# Patient Record
Sex: Male | Born: 2000 | Race: White | Hispanic: No | Marital: Single | Smoking: Current every day smoker
Health system: Southern US, Community
[De-identification: ages and names within clinical notes are randomized; demographics above are authoritative.]

## PROBLEM LIST (undated history)

## (undated) DIAGNOSIS — Z862 Personal history of diseases of the blood and blood-forming organs and certain disorders involving the immune mechanism: Secondary | ICD-10-CM

## (undated) DIAGNOSIS — S129XXA Fracture of neck, unspecified, initial encounter: Secondary | ICD-10-CM

## (undated) DIAGNOSIS — F603 Borderline personality disorder: Secondary | ICD-10-CM

## (undated) DIAGNOSIS — F329 Major depressive disorder, single episode, unspecified: Secondary | ICD-10-CM

## (undated) DIAGNOSIS — F319 Bipolar disorder, unspecified: Secondary | ICD-10-CM

## (undated) DIAGNOSIS — F32A Depression, unspecified: Secondary | ICD-10-CM

## (undated) DIAGNOSIS — G4700 Insomnia, unspecified: Secondary | ICD-10-CM

## (undated) HISTORY — DX: Insomnia, unspecified: G47.00

## (undated) HISTORY — DX: Bipolar disorder, unspecified: F31.9

## (undated) HISTORY — DX: Borderline personality disorder: F60.3

## (undated) HISTORY — DX: Fracture of neck, unspecified, initial encounter: S12.9XXA

---
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
## 2018-02-03 ENCOUNTER — Encounter (HOSPITAL_COMMUNITY): Payer: Self-pay

## 2018-02-03 ENCOUNTER — Ambulatory Visit (HOSPITAL_COMMUNITY)
Admission: EM | Admit: 2018-02-03 | Discharge: 2018-02-03 | Disposition: A | Discharge status: Home / Self Care | Payer: BLUE CROSS/BLUE SHIELD | Attending: Internal Medicine | Admitting: Internal Medicine

## 2018-02-03 ENCOUNTER — Other Ambulatory Visit: Payer: Self-pay

## 2018-02-03 DIAGNOSIS — R51 Headache: Secondary | ICD-10-CM | POA: Diagnosis not present

## 2018-02-03 DIAGNOSIS — R42 Dizziness and giddiness: Secondary | ICD-10-CM | POA: Diagnosis not present

## 2018-02-03 DIAGNOSIS — R519 Headache, unspecified: Secondary | ICD-10-CM

## 2018-02-03 MED ORDER — AMOXICILLIN-POT CLAVULANATE 875-125 MG PO TABS: 1.0000 | ORAL_TABLET | Freq: Two times a day (BID) | ORAL | 0 refills | Status: DC

## 2018-02-03 MED ORDER — ONDANSETRON 4 MG PO TBDP
4.0000 mg | ORAL_TABLET | Freq: Once | ORAL | Status: AC
  Administered 2018-02-03: 4 mg via ORAL

## 2018-02-03 MED ORDER — DIPHENHYDRAMINE HCL 50 MG/ML IJ SOLN: 25.0000 mg | Freq: Once | INTRAMUSCULAR | Status: DC

## 2018-02-03 MED ORDER — ONDANSETRON 4 MG PO TBDP
ORAL_TABLET | ORAL | Status: AC
  Filled 2018-02-03: qty 1

## 2018-02-03 MED ORDER — DEXAMETHASONE SODIUM PHOSPHATE 10 MG/ML IJ SOLN
10.0000 mg | Freq: Once | INTRAMUSCULAR | Status: AC
  Administered 2018-02-03: 10 mg via INTRAMUSCULAR

## 2018-02-03 MED ORDER — DEXAMETHASONE SODIUM PHOSPHATE 10 MG/ML IJ SOLN
INTRAMUSCULAR | Status: AC
  Filled 2018-02-03: qty 1

## 2018-02-03 MED ORDER — DIPHENHYDRAMINE HCL 50 MG/ML IJ SOLN
INTRAMUSCULAR | Status: AC
  Filled 2018-02-03: qty 1

## 2018-02-03 MED ORDER — FLUTICASONE PROPIONATE 50 MCG/ACT NA SUSP
1.0000 | Freq: Every day | NASAL | 0 refills | Status: DC
Start: 2018-02-03 — End: 2018-06-02

## 2018-02-03 NOTE — Discharge Instructions (Signed)
Continue to push fluids and take over the counter medications as directed on the back of the box for symptomatic relief.   Patient does not improve in the next 2 to 3 days recommend starting antibiotics.

## 2018-02-03 NOTE — ED Triage Notes (Signed)
Pt c/o headache , dizzy and off balance x 3 days
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
## 2018-06-02 ENCOUNTER — Other Ambulatory Visit: Payer: Self-pay

## 2018-06-02 ENCOUNTER — Encounter (HOSPITAL_COMMUNITY): Payer: Self-pay

## 2018-06-02 ENCOUNTER — Inpatient Hospital Stay (HOSPITAL_COMMUNITY)
Admission: RE | Admit: 2018-06-02 | Discharge: 2018-06-06 | DRG: 885 | Disposition: A | Discharge status: Home / Self Care | Payer: Federal, State, Local not specified - Other | Attending: Psychiatry | Admitting: Psychiatry

## 2018-06-02 DIAGNOSIS — F332 Major depressive disorder, recurrent severe without psychotic features: Principal | ICD-10-CM | POA: Diagnosis present

## 2018-06-02 DIAGNOSIS — F17210 Nicotine dependence, cigarettes, uncomplicated: Secondary | ICD-10-CM | POA: Diagnosis present

## 2018-06-02 DIAGNOSIS — Z23 Encounter for immunization: Secondary | ICD-10-CM

## 2018-06-02 DIAGNOSIS — R45851 Suicidal ideations: Secondary | ICD-10-CM | POA: Diagnosis present

## 2018-06-02 LAB — COMPREHENSIVE METABOLIC PANEL
ALT: 31 U/L (ref 0–44)
AST: 22 U/L (ref 15–41)
Albumin: 4.3 g/dL (ref 3.5–5.0)
Alkaline Phosphatase: 85 U/L (ref 38–126)
Anion gap: 10 (ref 5–15)
BUN: 7 mg/dL (ref 6–20)
CO2: 27 mmol/L (ref 22–32)
CREATININE: 0.77 mg/dL (ref 0.61–1.24)
Calcium: 9.3 mg/dL (ref 8.9–10.3)
Chloride: 102 mmol/L (ref 98–111)
GFR calc Af Amer: >60 mL/min (ref >60)
GFR calc non Af Amer: >60 mL/min (ref >60)
GLUCOSE: 93 mg/dL (ref 70–99)
Potassium: 3.7 mmol/L (ref 3.5–5.1)
Sodium: 139 mmol/L (ref 135–145)
Total Bilirubin: 0.9 mg/dL (ref 0.3–1.2)
Total Protein: 7.1 g/dL (ref 6.5–8.1)

## 2018-06-02 LAB — CBC
HCT: 46.5 % (ref 39.0–52.0)
Hemoglobin: 15.5 g/dL (ref 13.0–17.0)
MCH: 29.5 pg (ref 26.0–34.0)
MCHC: 33.3 g/dL (ref 30.0–36.0)
MCV: 88.6 fL (ref 80.0–100.0)
Platelets: 282 10*3/uL (ref 150–400)
RBC: 5.25 MIL/uL (ref 4.22–5.81)
RDW: 12 % (ref 11.5–15.5)
WBC: 9 10*3/uL (ref 4.0–10.5)
nRBC: 0 % (ref 0.0–0.2)

## 2018-06-02 LAB — LIPID PANEL
Cholesterol: 117 mg/dL (ref 0–200)
HDL: 33 mg/dL — ABNORMAL LOW (ref >40)
LDL Cholesterol: 66 mg/dL (ref 0–99)
Total CHOL/HDL Ratio: 3.5 RATIO
Triglycerides: 88 mg/dL (ref <150)
VLDL: 18 mg/dL (ref 0–40)

## 2018-06-02 LAB — RAPID URINE DRUG SCREEN, HOSP PERFORMED
Amphetamines: NOT DETECTED
Barbiturates: NOT DETECTED
Benzodiazepines: NOT DETECTED
COCAINE: NOT DETECTED
Opiates: NOT DETECTED
Tetrahydrocannabinol: POSITIVE — AB

## 2018-06-02 LAB — TSH: TSH: 1.179 u[IU]/mL (ref 0.350–4.500)

## 2018-06-02 MED ORDER — HYDROXYZINE HCL 50 MG PO TABS
50.0000 mg | ORAL_TABLET | Freq: Three times a day (TID) | ORAL | Status: DC | PRN
  Administered 2018-06-02 – 2018-06-05 (×5): 50 mg via ORAL
  Filled 2018-06-02 (×4): qty 1
  Filled 2018-06-02: qty 10
  Filled 2018-06-02: qty 1

## 2018-06-02 MED ORDER — ALUM & MAG HYDROXIDE-SIMETH 200-200-20 MG/5ML PO SUSP: 30.0000 mL | ORAL | Status: DC | PRN

## 2018-06-02 MED ORDER — TRAZODONE HCL 50 MG PO TABS
50.0000 mg | ORAL_TABLET | Freq: Every evening | ORAL | Status: DC | PRN
  Administered 2018-06-02: 50 mg via ORAL
  Filled 2018-06-02: qty 1

## 2018-06-02 MED ORDER — HYDROXYZINE HCL 25 MG PO TABS: 25.0000 mg | ORAL_TABLET | Freq: Three times a day (TID) | ORAL | Status: DC | PRN

## 2018-06-02 MED ORDER — INFLUENZA VAC SPLIT QUAD 0.5 ML IM SUSY
0.5000 mL | PREFILLED_SYRINGE | INTRAMUSCULAR | Status: AC
  Administered 2018-06-03: 0.5 mL via INTRAMUSCULAR
  Filled 2018-06-02: qty 0.5

## 2018-06-02 MED ORDER — ESTRADIOL 1 MG PO TABS
2.0000 mg | ORAL_TABLET | Freq: Every day | ORAL | Status: DC
  Administered 2018-06-02 – 2018-06-06 (×5): 2 mg via ORAL
  Filled 2018-06-02 (×2): qty 1
  Filled 2018-06-02: qty 2
  Filled 2018-06-02: qty 14
  Filled 2018-06-02 (×4): qty 1

## 2018-06-02 MED ORDER — ARIPIPRAZOLE 5 MG PO TABS
5.0000 mg | ORAL_TABLET | Freq: Every day | ORAL | Status: DC
  Administered 2018-06-02 – 2018-06-06 (×5): 5 mg via ORAL
  Filled 2018-06-02 (×5): qty 1
  Filled 2018-06-02: qty 7
  Filled 2018-06-02 (×4): qty 1

## 2018-06-02 MED ORDER — ACETAMINOPHEN 325 MG PO TABS
650.0000 mg | ORAL_TABLET | Freq: Four times a day (QID) | ORAL | Status: DC | PRN
  Administered 2018-06-02 – 2018-06-05 (×7): 650 mg via ORAL
  Filled 2018-06-02 (×7): qty 2

## 2018-06-02 MED ORDER — MAGNESIUM HYDROXIDE 400 MG/5ML PO SUSP
30.0000 mL | Freq: Every day | ORAL | Status: DC | PRN
  Administered 2018-06-03: 30 mL via ORAL
  Filled 2018-06-02: qty 30

## 2018-06-02 MED ORDER — GABAPENTIN 100 MG PO CAPS
100.0000 mg | ORAL_CAPSULE | Freq: Two times a day (BID) | ORAL | Status: DC | PRN
  Administered 2018-06-02 – 2018-06-03 (×2): 100 mg via ORAL
  Filled 2018-06-02 (×2): qty 1

## 2018-06-02 MED ORDER — VENLAFAXINE HCL ER 75 MG PO CP24
75.0000 mg | ORAL_CAPSULE | Freq: Every day | ORAL | Status: DC
  Administered 2018-06-02 – 2018-06-06 (×5): 75 mg via ORAL
  Filled 2018-06-02 (×7): qty 1
  Filled 2018-06-02: qty 7

## 2018-06-02 MED ORDER — PNEUMOCOCCAL VAC POLYVALENT 25 MCG/0.5ML IJ INJ
0.5000 mL | INJECTION | INTRAMUSCULAR | Status: AC
  Administered 2018-06-03: 0.5 mL via INTRAMUSCULAR

## 2018-06-02 NOTE — Plan of Care (Signed)
  Problem: Education: Goal: Emotional status will improve Outcome: Progressing   

## 2018-06-02 NOTE — Progress Notes (Signed)
D Pt is observed OOB UAL on the 400 hall, " she" ( as he requests to be called) is approrpiately dressed, makes good eye contact and endorses a flat, depressed, quiet affect.      A She completed her daily assessment and on this she wrote she denied SI today and she rated her depression, hopelessness and anxeity " 5/5/4", respectively.      R Safety is in place.

## 2018-06-02 NOTE — H&P (Addendum)
Behavioral Health Medical Screening Exam  Brandon Cox is an 18 y.o. adult.  Patient reports that he has not taken medications in several weeks. Will restart Abilify at 5 mg and Effexor at 75 mg.  Total Time spent with patient: 20 minutes  Psychiatric Specialty Exam: Physical Exam  Constitutional: She is oriented to person, place, and time. She appears well-developed and well-nourished. No distress.  HENT:  Head: Normocephalic and atraumatic.  Right Ear: External ear normal.  Left Ear: External ear normal.  Eyes: Pupils are equal, round, and reactive to light.  Respiratory: Effort normal. No respiratory distress.  Musculoskeletal: Normal range of motion.  Neurological: She is alert and oriented to person, place, and time.  Skin: She is not diaphoretic.  Psychiatric: Her mood appears anxious. Her affect is blunt. She is not withdrawn and not actively hallucinating. Thought content is not paranoid and not delusional. She expresses impulsivity and inappropriate judgment. She exhibits a depressed mood. She expresses suicidal ideation. She expresses no homicidal ideation. She expresses no suicidal plans.    Review of Systems  Constitutional: Negative for chills, diaphoresis, fever, malaise/fatigue and weight loss.  Psychiatric/Behavioral: Positive for depression and suicidal ideas. Negative for hallucinations, memory loss and substance abuse. The patient is nervous/anxious and has insomnia.   All other systems reviewed and are negative.   There were no vitals taken for this visit.There is no height or weight on file to calculate BMI.  General Appearance: Casual and Disheveled  Eye Contact:  Fair  Speech:  Clear and Coherent and Normal Rate  Volume:  Decreased  Mood:  Anxious, Depressed, Hopeless, Irritable and Worthless  Affect:  Blunt and Congruent  Thought Process:  Coherent, Goal Directed and Descriptions of Associations: Intact  Orientation:  Full (Time, Place, and Person)   Thought Content:  Logical and Hallucinations: None  Suicidal Thoughts:  Yes.  without intent/plan  Homicidal Thoughts:  No  Memory:  Immediate;   Good Recent;   Fair  Judgement:  Intact  Insight:  Fair  Psychomotor Activity:  Normal  Concentration: Concentration: Fair and Attention Span: Fair  Recall:  [employer] of Knowledge:Good  Language: Good  Akathisia:  NA  Handed:  Right  AIMS (if indicated):     Assets:  Communication Skills Desire for Improvement Housing Intimacy Leisure Time Physical Health  Sleep:       Musculoskeletal: Strength & Muscle Tone: within normal limits Gait & Station: normal    Recommendations:  Based on my evaluation the patient does not appear to have an emergency medical condition.   Patient reports that he has not taken medications in several weeks. Will resume Abilify at 5 mg and Effexor at 75 mg.  Jackelyn Poling, NP 06/02/2018, 2:32 AM

## 2018-06-02 NOTE — Progress Notes (Signed)
D: Patient observed resting in bed initially this evening however promptly came down for wrap up group. Patient's only complaint at this time is being tired as she was admitted in the early AM hours last night. Patient's affect anxious and depressed with congruent mood. Rated chronic cervical pain at a 3/10 but does not desire anything for pain. No other physical complaints.   A: Medicated per orders, prn neurontin and trazadone given per her request. Medication education provided. Level III obs in place for safety. Emotional support offered. Patient encouraged to complete Suicide Safety Plan before discharge. Encouraged to attend and participate in unit programming.   R: Patient verbalizes understanding of POC. On reassess, patient is asleep. Patient denies SI/HI/AVH and remains safe on level III obs. Will continue to monitor throughout the night.

## 2018-06-02 NOTE — [facility] Suicide Risk Assessment (Signed)
[facility] Admission Suicide Risk Assessment   Nursing information obtained from:  Patient Demographic factors:  Male, Cardell Peach, lesbian, or bisexual orientation, Caucasian Current Mental Status:  Suicidal ideation indicated by patient Loss Factors:  Financial problems / change in socioeconomic status Historical Factors:  Prior suicide attempts, Family history of mental illness or substance abuse Risk Reduction Factors:  Employed  Total Time spent with patient: 20 minutes Principal Problem: <principal problem not specified> Diagnosis:  Active Problems:   Severe recurrent major depression without psychotic features (HCC)  Subjective Data: Patient is seen and examined.  Patient is a 18 year old transgender male to male who presented as a walk-in to the behavioral health hospital on 06/02/2018 with suicidal ideation.  The patient has a past psychiatric history significant for major depression and was last admitted to our facility on 08/14/2017.  She was in the hospital for 4 days at that time.  She was discharged on Abilify 5 mg p.o. daily, estradiol 1 mg p.o. twice daily, trazodone 50 mg p.o. nightly and venlafaxine 75 mg p.o. daily.  The patient stated that she was doing well, but unfortunately over the last several months had lost her insurance.  After she lost her insurance she was unable to obtain her psychiatric medicines as well as her hormonal medicines for her transgender transition.  She stated that things over the last week or 2 it worsened.  She was having thoughts of self-harm.  Prior to being admitted to the hospital last night she was thinking about cutting herself.  The decision was made because of the worsening depression that she should be admitted to the hospital for evaluation and stabilization.  She denied any recent additional stressors.  It should be noted that the patient's family is not supportive of her transgender physician.  Continued Clinical Symptoms:  Alcohol Use Disorder  Identification Test Final Score (AUDIT): 0 The "Alcohol Use Disorders Identification Test", Guidelines for Use in Primary Care, Second Edition.  World Science writer [facility]). Score between 0-7:  no or low risk or alcohol related problems. Score between 8-15:  moderate risk of alcohol related problems. Score between 16-19:  high risk of alcohol related problems. Score 20 or above:  warrants further diagnostic evaluation for alcohol dependence and treatment.   CLINICAL FACTORS:   Depression:   Hopelessness Impulsivity Insomnia Previous Psychiatric Diagnoses and Treatments   Musculoskeletal: Strength & Muscle Tone: within normal limits Gait & Station: normal Patient leans: N/A  Psychiatric Specialty Exam: Physical Exam  Nursing note and vitals reviewed. Constitutional: She is oriented to person, place, and time. She appears well-developed and well-nourished.  HENT:  Head: Normocephalic and atraumatic.  Respiratory: Effort normal.  Neurological: She is alert and oriented to person, place, and time.    ROS  Blood pressure (!) 142/88, pulse 67, temperature 98.4 F (36.9 C), temperature source Oral, resp. rate 16, height 6\' 1"  (1.854 m), weight 101.2 kg.Body mass index is 29.42 kg/m.  General Appearance: Casual  Eye Contact:  Fair  Speech:  Normal Rate  Volume:  Decreased  Mood:  Depressed  Affect:  Congruent  Thought Process:  Coherent and Descriptions of Associations: Intact  Orientation:  Full (Time, Place, and Person)  Thought Content:  Logical  Suicidal Thoughts:  Yes.  without intent/plan  Homicidal Thoughts:  No  Memory:  Immediate;   Fair Recent;   Fair Remote;   Fair  Judgement:  Intact  Insight:  Fair  Psychomotor Activity:  Psychomotor Retardation  Concentration:  Concentration: Fair  and Attention Span: Fair  Recall:  [employer] of Knowledge:  Fair  Language:  Good  Akathisia:  Negative  Handed:  Right  AIMS (if indicated):     Assets:  Communication  Skills Desire for Improvement Housing Leisure Time Physical Health Resilience  ADL's:  Intact  Cognition:  WNL  Sleep:  Number of Hours: 1.25      COGNITIVE FEATURES THAT CONTRIBUTE TO RISK:  None    SUICIDE RISK:   Mild:  Suicidal ideation of limited frequency, intensity, duration, and specificity.  There are no identifiable plans, no associated intent, mild dysphoria and related symptoms, good self-control (both objective and subjective assessment), few other risk factors, and identifiable protective factors, including available and accessible social support.  PLAN OF CARE: Patient is seen and examined.  Patient is a 18 year old male to male transgender patient with a past psychiatric history significant for major depression.  She will be admitted to the hospital.  She will be integrated into the milieu.  She will be encouraged to attend groups and work on Pharmacologist.  Her Effexor and Abilify will be restarted at their previous dosages.  We will additionally give her the Lupron and estradiol.  We will have to find out about resources available for mental health as well as medical issues for follow-up as an outpatient given the loss of her insurance.  Review of the laboratories that are available at this time show a normal CBC.  The urine drug screen was positive for marijuana.  I certify that inpatient services furnished can reasonably be expected to improve the patient's condition.   Antonieta Pert, MD 06/02/2018, 7:49 AM

## 2018-06-02 NOTE — H&P (Signed)
Psychiatric Admission Assessment Adult  Patient Identification: Brandon Cox MRN:  [id number] Date of Evaluation:  06/02/2018 Chief Complaint:  MDD, Recurrent, Severe, Without Psychotic Features Principal Diagnosis: <principal problem not specified> Diagnosis:  Active Problems:   Severe recurrent major depression without psychotic features (HCC)  History of Present Illness: Patient is seen and examined.  Patient is a 18 year old transgender male to male who presented as a walk-in to the behavioral health hospital on 06/02/2018 with suicidal ideation.  The patient has a past psychiatric history significant for major depression and was last admitted to our facility on 08/14/2017.  She was in the hospital for 4 days at that time.  She was discharged on Abilify 5 mg p.o. daily, estradiol 1 mg p.o. twice daily, trazodone 50 mg p.o. nightly and venlafaxine 75 mg p.o. daily.  The patient stated that she was doing well, but unfortunately over the last several months had lost her insurance.  After she lost her insurance she was unable to obtain her psychiatric medicines as well as her hormonal medicines for her transgender transition.  She stated that things over the last week or 2 it worsened.  She was having thoughts of self-harm.  Prior to being admitted to the hospital last night she was thinking about cutting herself.  The decision was made because of the worsening depression that she should be admitted to the hospital for evaluation and stabilization.  She denied any recent additional stressors.  It should be noted that the patient's family is not supportive of her transgender physician.  Associated Signs/Symptoms: Depression Symptoms:  depressed mood, anhedonia, insomnia, psychomotor retardation, fatigue, feelings of worthlessness/guilt, difficulty concentrating, hopelessness, suicidal thoughts without plan, anxiety, loss of energy/fatigue, disturbed sleep, (Hypo) Manic Symptoms:   Impulsivity, Anxiety Symptoms:  Excessive Worry, Psychotic Symptoms:  Denied PTSD Symptoms: Negative Total Time spent with patient: 45 minutes  Past Psychiatric History: Patient's last admission to our facility was on 08/14/2017.  She was diagnosed with major depression at that time.  She also had a prior psychiatric hospitalization at [facility].  There was a suicide attempt at age 18.  A remote history of self cutting while in middle school.  Is the patient at risk to self? Yes.    Has the patient been a risk to self in the past 6 months? Yes.    Has the patient been a risk to self within the distant past? No.  Is the patient a risk to others? No.  Has the patient been a risk to others in the past 6 months? No.  Has the patient been a risk to others within the distant past? No.   Prior Inpatient Therapy: Prior Inpatient Therapy: Yes Prior Therapy Dates: 2019, 2018 Prior Therapy Facilty/Provider(s): [facility] Reason for Treatment: MDD Prior Outpatient Therapy: Prior Outpatient Therapy: Yes Prior Therapy Dates: 2018 Prior Therapy Facilty/Provider(s): [facility]  Reason for Treatment: DEPRESSION Does patient have an ACCT team?: No Does patient have Intensive In-House Services?  : No Does patient have [facility] services? : No Does patient have P4CC services?: No  Alcohol Screening: 1. How often do you have a drink containing alcohol?: Never 2. How many drinks containing alcohol do you have on a typical day when you are drinking?: 1 or 2 3. How often do you have six or more drinks on one occasion?: Never AUDIT-C Score: 0 9. Have you or someone else been injured as a result of your drinking?: No 10. Has a relative or friend or  a doctor or another health worker been concerned about your drinking or suggested you cut down?: No Alcohol Use Disorder Identification Test Final Score (AUDIT): 0 Alcohol Brief Interventions/Follow-up: AUDIT Score <7 follow-up not indicated Substance Abuse  History in the last 12 months:  No. Consequences of Substance Abuse: Negative Previous Psychotropic Medications: Yes  Psychological Evaluations: Yes  Past Medical History:  Past Medical History:  Diagnosis Date  . Depression   . Fracture, cervical vertebra (HCC) 10/22/2015   c6  . History of ITP    History reviewed. No pertinent surgical history. Family History:  Family History  Problem Relation Age of Onset  . Healthy Mother   . Drug abuse Cousin    Family Psychiatric  History: Denied Tobacco Screening: Have you used any form of tobacco in the last 30 days? (Cigarettes, Smokeless Tobacco, Cigars, and/or Pipes): Yes Tobacco use, Select all that apply: 5 or more cigarettes per day Are you interested in Tobacco Cessation Medications?: No, patient refused Counseled patient on smoking cessation including recognizing danger situations, developing coping skills and basic information about quitting provided: Refused/Declined practical counseling Social History:  Social History   Substance and Sexual Activity  Alcohol Use No  . Frequency: Never     Social History   Substance and Sexual Activity  Drug Use Yes  . Types: Marijuana   Comment: used a couple of times in the last couple weeks    Additional Social History: Marital status: Single    Pain Medications: See MAR Prescriptions: See MAR Over the Counter: See MAR History of alcohol / drug use?: No history of alcohol / drug abuse                    Allergies:  No Known Allergies Lab Results:  Results for orders placed or performed during the hospital encounter of 06/02/18 (from the past 48 hour(s))  Urine rapid drug screen (hosp performed)not at [facility]     Status: Abnormal   Collection Time: 06/02/18  4:06 AM  Result Value Ref Range   Opiates NONE DETECTED NONE DETECTED   Cocaine NONE DETECTED NONE DETECTED   Benzodiazepines NONE DETECTED NONE DETECTED   Amphetamines NONE DETECTED NONE DETECTED    Tetrahydrocannabinol POSITIVE (A) NONE DETECTED   Barbiturates NONE DETECTED NONE DETECTED    Comment: (NOTE) DRUG SCREEN FOR MEDICAL PURPOSES ONLY.  IF CONFIRMATION IS NEEDED FOR ANY PURPOSE, NOTIFY LAB WITHIN 5 DAYS. LOWEST DETECTABLE LIMITS FOR URINE DRUG SCREEN Drug Class                     Cutoff (ng/mL) Amphetamine and metabolites    1000 Barbiturate and metabolites    200 Benzodiazepine                 200 Tricyclics and metabolites     300 Opiates and metabolites        300 Cocaine and metabolites        300 THC                            50 Performed at [facility], [address] [place], [place] [address]   CBC     Status: None   Collection Time: 06/02/18  6:31 AM  Result Value Ref Range   WBC 9.0 4.0 - 10.5 K/uL   RBC 5.25 4.22 - 5.81 MIL/uL   Hemoglobin 15.5 13.0 - 17.0 g/dL  HCT 46.5 39.0 - 52.0 %   MCV 88.6 80.0 - 100.0 fL   MCH 29.5 26.0 - 34.0 pg   MCHC 33.3 30.0 - 36.0 g/dL   RDW 82.9 56.2 - 13.0 %   Platelets 282 150 - 400 K/uL   nRBC 0.0 0.0 - 0.2 %    Comment: Performed at [facility], [address], [place], [place] [address]  Comprehensive metabolic panel     Status: None   Collection Time: 06/02/18  6:31 AM  Result Value Ref Range   Sodium 139 135 - 145 mmol/L   Potassium 3.7 3.5 - 5.1 mmol/L   Chloride 102 98 - 111 mmol/L   CO2 27 22 - 32 mmol/L   Glucose, Bld 93 70 - 99 mg/dL   BUN 7 6 - 20 mg/dL   Creatinine, Ser 4.69 0.61 - 1.24 mg/dL   Calcium 9.3 8.9 - 62.9 mg/dL   Total Protein 7.1 6.5 - 8.1 g/dL   Albumin 4.3 3.5 - 5.0 g/dL   AST 22 15 - 41 U/L   ALT 31 0 - 44 U/L   Alkaline Phosphatase 85 38 - 126 U/L   Total Bilirubin 0.9 0.3 - 1.2 mg/dL   GFR calc non Af Amer >60 >60 mL/min   GFR calc Af Amer >60 >60 mL/min   Anion gap 10 5 - 15    Comment: Performed at [facility], [address] [place], [place] [address]  Lipid panel     Status: Abnormal   Collection Time:  06/02/18  6:31 AM  Result Value Ref Range   Cholesterol 117 0 - 200 mg/dL   Triglycerides 88 <324 mg/dL   HDL 33 (L) >40 mg/dL   Total CHOL/HDL Ratio 3.5 RATIO   VLDL 18 0 - 40 mg/dL   LDL Cholesterol 66 0 - 99 mg/dL    Comment:        Total Cholesterol/HDL:CHD Risk Coronary Heart Disease Risk Table                     Men   Women  1/2 Average Risk   3.4   3.3  Average Risk       5.0   4.4  2 X Average Risk   9.6   7.1  3 X Average Risk  23.4   11.0        Use the calculated Patient Ratio above and the CHD Risk Table to determine the patient's CHD Risk.        ATP III CLASSIFICATION (LDL):  <100     mg/dL   Optimal  102-725  mg/dL   Near or Above                    Optimal  130-159  mg/dL   Borderline  366-440  mg/dL   High  >347     mg/dL   Very High Performed at [facility], [address], [place], [place] [address]   TSH     Status: None   Collection Time: 06/02/18  6:31 AM  Result Value Ref Range   TSH 1.179 0.350 - 4.500 uIU/mL    Comment: Performed by a 3rd Generation assay with a functional sensitivity of <=0.01 uIU/mL. Performed at [facility], [address], [place], [place] [address]     Blood Alcohol level:  Lab Results  Component Value Date   ETH <10  02/16/2017    Metabolic Disorder Labs:  No results found for: HGBA1C, MPG No results found for: PROLACTIN Lab Results  Component Value Date   CHOL 117 06/02/2018   TRIG 88 06/02/2018   HDL 33 (L) 06/02/2018   CHOLHDL 3.5 06/02/2018   VLDL 18 06/02/2018   LDLCALC 66 06/02/2018    Current Medications: Current Facility-Administered Medications  Medication Dose Route Frequency Provider Last Rate Last Dose  . acetaminophen (TYLENOL) tablet 650 mg  650 mg Oral Q6H PRN Nira Conn A, NP   650 mg at 06/02/18 0402  . alum & mag hydroxide-simeth (MAALOX/MYLANTA) 200-200-20 MG/5ML suspension 30 mL  30 mL Oral Q4H PRN Nira Conn A, NP      . ARIPiprazole (ABILIFY)  tablet 5 mg  5 mg Oral Daily Nira Conn A, NP   5 mg at 06/02/18 1120  . estradiol (ESTRACE) tablet 2 mg  2 mg Oral Daily Nira Conn A, NP   2 mg at 06/02/18 1119  . gabapentin (NEURONTIN) capsule 100 mg  100 mg Oral BID PRN Nira Conn A, NP      . hydrOXYzine (ATARAX/VISTARIL) tablet 50 mg  50 mg Oral TID PRN Nira Conn A, NP   50 mg at 06/02/18 0402  . [START ON 06/03/2018] Influenza vac split quadrivalent PF (FLUARIX) injection 0.5 mL  0.5 mL Intramuscular Tomorrow-1000 Malvin Johns, MD      . magnesium hydroxide (MILK OF MAGNESIA) suspension 30 mL  30 mL Oral Daily PRN Jackelyn Poling, NP      . Melene Muller ON 06/03/2018] pneumococcal 23 valent vaccine (PNU-IMMUNE) injection 0.5 mL  0.5 mL Intramuscular Tomorrow-1000 Malvin Johns, MD      . traZODone (DESYREL) tablet 50 mg  50 mg Oral QHS PRN Nira Conn A, NP      . venlafaxine XR (EFFEXOR-XR) 24 hr capsule 75 mg  75 mg Oral Q breakfast Nira Conn A, NP   75 mg at 06/02/18 1121   PTA Medications: Medications Prior to Admission  Medication Sig Dispense Refill Last Dose  . estradiol (ESTRACE) 1 MG tablet Take 2 mg by mouth daily.     Marland Kitchen amitriptyline (ELAVIL) 25 MG tablet Take 25 mg by mouth at bedtime as needed for sleep.     . ARIPiprazole (ABILIFY) 10 MG tablet Take 10 mg by mouth daily.     Marland Kitchen gabapentin (NEURONTIN) 100 MG capsule Take 100 mg by mouth 2 (two) times daily as needed for anxiety.     . hydrOXYzine (ATARAX/VISTARIL) 50 MG tablet Take 50 mg by mouth 3 (three) times daily as needed. for anxiety  3   . venlafaxine XR (EFFEXOR-XR) 150 MG 24 hr capsule Take 150 mg by mouth daily.       Musculoskeletal: Strength & Muscle Tone: within normal limits Gait & Station: normal Patient leans: N/A  Psychiatric Specialty Exam: Physical Exam  Constitutional: She is oriented to person, place, and time. She appears well-developed and well-nourished.  HENT:  Head: Normocephalic and atraumatic.  Respiratory: Effort normal.   Neurological: She is alert and oriented to person, place, and time.    ROS  Blood pressure (!) 142/88, pulse 67, temperature 98.4 F (36.9 C), temperature source Oral, resp. rate 16, height 6\' 1"  (1.854 m), weight 101.2 kg.Body mass index is 29.42 kg/m.  General Appearance: Casual  Eye Contact:  Fair  Speech:  Normal Rate  Volume:  Decreased  Mood:  Depressed  Affect:  Congruent  Thought Process:  Coherent and Descriptions of Associations: Intact  Orientation:  Full (Time, Place, and Person)  Thought Content:  Logical  Suicidal Thoughts:  Yes.  without intent/plan  Homicidal Thoughts:  No  Memory:  Immediate;   Fair Recent;   Fair Remote;   Fair  Judgement:  Intact  Insight:  Fair  Psychomotor Activity:  Psychomotor Retardation  Concentration:  Concentration: Fair and Attention Span: Fair  Recall:  [employer]  Fund of Knowledge:  Fair  Language:  Fair  Akathisia:  Negative  Handed:  Right  AIMS (if indicated):     Assets:  Communication Skills Desire for Improvement Financial Resources/Insurance Housing Physical Health Resilience Social Support  ADL's:  Intact  Cognition:  WNL  Sleep:  Number of Hours: 1.25    Treatment Plan Summary: Daily contact with patient to assess and evaluate symptoms and progress in treatment, Medication management and Plan : Patient is seen and examined.  Patient is a 18 year old male to male transgender patient with a past psychiatric history significant for major depression.  She will be admitted to the hospital.  She will be integrated into the milieu.  She will be encouraged to attend groups and work on Pharmacologistcoping skills.  Her Effexor and Abilify will be restarted at their previous dosages.  We will additionally give her the Lupron and estradiol.  We will have to find out about resources available for mental health as well as medical issues for follow-up as an outpatient given the loss of her insurance.  Review of the laboratories that are available  at this time show a normal CBC.  The urine drug screen was positive for marijuana.  Observation Level/Precautions:  15 minute checks  Laboratory:  Chemistry Profile  Psychotherapy:    Medications:    Consultations:    Discharge Concerns:    Estimated LOS:  Other:     Physician Treatment Plan for Primary Diagnosis: <principal problem not specified> Long Term Goal(s): Improvement in symptoms so as ready for discharge  Short Term Goals: Ability to identify changes in lifestyle to reduce recurrence of condition will improve, Ability to verbalize feelings will improve, Ability to disclose and discuss suicidal ideas, Ability to demonstrate self-control will improve, Ability to identify and develop effective coping behaviors will improve, Ability to maintain clinical measurements within normal limits will improve and Compliance with prescribed medications will improve  Physician Treatment Plan for Secondary Diagnosis: Active Problems:   Severe recurrent major depression without psychotic features (HCC)  Long Term Goal(s): Improvement in symptoms so as ready for discharge  Short Term Goals: Ability to identify changes in lifestyle to reduce recurrence of condition will improve, Ability to verbalize feelings will improve, Ability to disclose and discuss suicidal ideas, Ability to demonstrate self-control will improve, Ability to identify and develop effective coping behaviors will improve, Ability to maintain clinical measurements within normal limits will improve and Compliance with prescribed medications will improve  I certify that inpatient services furnished can reasonably be expected to improve the patient's condition.    Antonieta PertGreg Lawson , MD 2/15/202012:27 PM

## 2018-06-02 NOTE — Tx Team (Signed)
Initial Treatment Plan 06/02/2018 3:14 AM [name] [id number]    PATIENT STRESSORS: Financial difficulties Marital or family conflict Medication change or noncompliance   PATIENT STRENGTHS: Communication skills General fund of knowledge Motivation for treatment/growth Physical Health Work skills   PATIENT IDENTIFIED PROBLEMS: Depression  Suicidal ideation  "Not have to come back to the hospital"  "Improve my self worth"               DISCHARGE CRITERIA:  Improved stabilization in mood, thinking, and/or behavior Need for constant or close observation no longer present Verbal commitment to aftercare and medication compliance  PRELIMINARY DISCHARGE PLAN: Outpatient therapy Medication management  PATIENT/FAMILY INVOLVEMENT: This treatment plan has been presented to and reviewed with the patient, Brandon Cox.  The patient and family have been given the opportunity to ask questions and make suggestions.  Levin Bacon, RN 06/02/2018, 3:14 AM

## 2018-06-02 NOTE — H&P (Signed)
Psychiatric Admission Assessment Adult  Patient Identification: Brandon Cox MRN:  [id number] Date of Evaluation:  06/02/2018 Chief Complaint:  MDD, Recurrent, Severe, Without Psychotic Features Principal Diagnosis: Severe recurrent major depression without psychotic features (HCC) Diagnosis:  Principal Problem:   Severe recurrent major depression without psychotic features (HCC)  History of Present Illness: On admission to the hospital:  Brandon Cox is an 18 y.o. adult who presents to [facility] as a walk in. Pt is male to male transgender and prefers to be called "Brandon Cox." Pt states she has been increasingly suicidal and has racing thoughts of suicide. Pt states she was at work and had thoughts of cutting her wrists. Pt states she tried to stop herself but the thoughts persisted and she ended up using a knife to cut herself. Pt states she has attempted suicide at least 6 times in the past by overdosing on medication several times and trying to hang herself multiple times. Pt states her most recent suicide attempt was about 3 months ago. Pt states she tried to hang herself from her balcony but she was too tall and the rope did not work.   Pt states her suicide attempts are impulsive and reports they are not triggered by any specific event. Pt states she lives at home with her parents who are unsupportive. Pt is not followed by any current MH OPT provider and states the last time she saw a provider was about 1 year ago. Pt states she does not believe that she can keep herself safe and agrees to sign VOL consent for treatment.   On assessment today, depression is 3/10, anxiety is high, denies suicidal ideations.  Does not like to be around people based on his being bullied his whole life for his speech apraxia issues.  Never processed this but is agreeable to address in therapy.     Associated Signs/Symptoms: Depression Symptoms:  depressed mood, hopelessness, loss of energy/fatigue, disturbed  sleep, (Hypo) Manic Symptoms:  Labiality of Mood, Anxiety Symptoms:  Excessive Worry, Psychotic Symptoms:  none PTSD Symptoms: Had a traumatic exposure:  years of bullying Total Time spent with patient: 45 minutes  Past Psychiatric History: depression, personality disorder  Is the patient at risk to self? Yes.    Has the patient been a risk to self in the past 6 months? Yes.    Has the patient been a risk to self within the distant past? Yes.    Is the patient a risk to others? No.  Has the patient been a risk to others in the past 6 months? No.  Has the patient been a risk to others within the distant past? No.   Prior Inpatient Therapy: Prior Inpatient Therapy: Yes Prior Therapy Dates: 2019, 2018 Prior Therapy Facilty/Provider(s): [facility] Reason for Treatment: MDD Prior Outpatient Therapy: Prior Outpatient Therapy: Yes Prior Therapy Dates: 2018 Prior Therapy Facilty/Provider(s): [facility]  Reason for Treatment: DEPRESSION Does patient have an ACCT team?: No Does patient have Intensive In-House Services?  : No Does patient have [facility] services? : No Does patient have P4CC services?: No  Alcohol Screening: 1. How often do you have a drink containing alcohol?: Never 2. How many drinks containing alcohol do you have on a typical day when you are drinking?: 1 or 2 3. How often do you have six or more drinks on one occasion?: Never AUDIT-C Score: 0 9. Have you or someone else been injured as a result of your drinking?: No 10. Has a relative  or friend or a doctor or another health worker been concerned about your drinking or suggested you cut down?: No Alcohol Use Disorder Identification Test Final Score (AUDIT): 0 Alcohol Brief Interventions/Follow-up: AUDIT Score <7 follow-up not indicated Substance Abuse History in the last 12 months:  No. Consequences of Substance Abuse: NA Previous Psychotropic Medications: Yes  Psychological Evaluations: Yes  Past Medical History:   Past Medical History:  Diagnosis Date  . Depression   . Fracture, cervical vertebra (HCC) 10/22/2015   c6  . History of ITP    History reviewed. No pertinent surgical history. Family History:  Family History  Problem Relation Age of Onset  . Healthy Mother   . Drug abuse Cousin    Family Psychiatric  History: cousin with substance abuse Tobacco Screening: Have you used any form of tobacco in the last 30 days? (Cigarettes, Smokeless Tobacco, Cigars, and/or Pipes): Yes Tobacco use, Select all that apply: 5 or more cigarettes per day Are you interested in Tobacco Cessation Medications?: No, patient refused Counseled patient on smoking cessation including recognizing danger situations, developing coping skills and basic information about quitting provided: Refused/Declined practical counseling Social History:  Social History   Substance and Sexual Activity  Alcohol Use No  . Frequency: Never     Social History   Substance and Sexual Activity  Drug Use Yes  . Types: Marijuana   Comment: used a couple of times in the last couple weeks    Additional Social History: Marital status: Single    Pain Medications: See MAR Prescriptions: See MAR Over the Counter: See MAR History of alcohol / drug use?: No history of alcohol / drug abuse  Allergies:  No Known Allergies Lab Results:  Results for orders placed or performed during the hospital encounter of 06/02/18 (from the past 48 hour(s))  Urine rapid drug screen (hosp performed)not at [facility]     Status: Abnormal   Collection Time: 06/02/18  4:06 AM  Result Value Ref Range   Opiates NONE DETECTED NONE DETECTED   Cocaine NONE DETECTED NONE DETECTED   Benzodiazepines NONE DETECTED NONE DETECTED   Amphetamines NONE DETECTED NONE DETECTED   Tetrahydrocannabinol POSITIVE (A) NONE DETECTED   Barbiturates NONE DETECTED NONE DETECTED    Comment: (NOTE) DRUG SCREEN FOR MEDICAL PURPOSES ONLY.  IF CONFIRMATION IS NEEDED FOR ANY PURPOSE,  NOTIFY LAB WITHIN 5 DAYS. LOWEST DETECTABLE LIMITS FOR URINE DRUG SCREEN Drug Class                     Cutoff (ng/mL) Amphetamine and metabolites    1000 Barbiturate and metabolites    200 Benzodiazepine                 200 Tricyclics and metabolites     300 Opiates and metabolites        300 Cocaine and metabolites        300 THC                            50 Performed at [facility], [address] [place], [place] [address]   CBC     Status: None   Collection Time: 06/02/18  6:31 AM  Result Value Ref Range   WBC 9.0 4.0 - 10.5 K/uL   RBC 5.25 4.22 - 5.81 MIL/uL   Hemoglobin 15.5 13.0 - 17.0 g/dL   HCT 60.446.5 54.039.0 - 98.152.0 %   MCV 88.6  80.0 - 100.0 fL   MCH 29.5 26.0 - 34.0 pg   MCHC 33.3 30.0 - 36.0 g/dL   RDW 43.1 54.0 - 08.6 %   Platelets 282 150 - 400 K/uL   nRBC 0.0 0.0 - 0.2 %    Comment: Performed at [facility], [address] [place], [place] [address]  Comprehensive metabolic panel     Status: None   Collection Time: 06/02/18  6:31 AM  Result Value Ref Range   Sodium 139 135 - 145 mmol/L   Potassium 3.7 3.5 - 5.1 mmol/L   Chloride 102 98 - 111 mmol/L   CO2 27 22 - 32 mmol/L   Glucose, Bld 93 70 - 99 mg/dL   BUN 7 6 - 20 mg/dL   Creatinine, Ser 0.93 0.61 - 1.24 mg/dL   Calcium 9.3 8.9 - 26.7 mg/dL   Total Protein 7.1 6.5 - 8.1 g/dL   Albumin 4.3 3.5 - 5.0 g/dL   AST 22 15 - 41 U/L   ALT 31 0 - 44 U/L   Alkaline Phosphatase 85 38 - 126 U/L   Total Bilirubin 0.9 0.3 - 1.2 mg/dL   GFR calc non Af Amer >60 >60 mL/min   GFR calc Af Amer >60 >60 mL/min   Anion gap 10 5 - 15    Comment: Performed at [facility], [address], [place], [place] [address]  Lipid panel     Status: Abnormal   Collection Time: 06/02/18  6:31 AM  Result Value Ref Range   Cholesterol 117 0 - 200 mg/dL   Triglycerides 88 <099 mg/dL   HDL 33 (L) >83 mg/dL   Total CHOL/HDL Ratio 3.5 RATIO   VLDL 18 0 - 40 mg/dL   LDL  Cholesterol 66 0 - 99 mg/dL    Comment:        Total Cholesterol/HDL:CHD Risk Coronary Heart Disease Risk Table                     Men   Women  1/2 Average Risk   3.4   3.3  Average Risk       5.0   4.4  2 X Average Risk   9.6   7.1  3 X Average Risk  23.4   11.0        Use the calculated Patient Ratio above and the CHD Risk Table to determine the patient's CHD Risk.        ATP III CLASSIFICATION (LDL):  <100     mg/dL   Optimal  382-505  mg/dL   Near or Above                    Optimal  130-159  mg/dL   Borderline  397-673  mg/dL   High  >419     mg/dL   Very High Performed at [facility], [address], [place], [place] [address]   TSH     Status: None   Collection Time: 06/02/18  6:31 AM  Result Value Ref Range   TSH 1.179 0.350 - 4.500 uIU/mL    Comment: Performed by a 3rd Generation assay with a functional sensitivity of <=0.01 uIU/mL. Performed at [facility], [address] [place], [place] [address]     Blood Alcohol level:  Lab Results  Component Value Date   [facility] <10 02/16/2017    Metabolic Disorder Labs:  No results  found for: HGBA1C, MPG No results found for: PROLACTIN Lab Results  Component Value Date   CHOL 117 06/02/2018   TRIG 88 06/02/2018   HDL 33 (L) 06/02/2018   CHOLHDL 3.5 06/02/2018   VLDL 18 06/02/2018   LDLCALC 66 06/02/2018    Current Medications: Current Facility-Administered Medications  Medication Dose Route Frequency Provider Last Rate Last Dose  . acetaminophen (TYLENOL) tablet 650 mg  650 mg Oral Q6H PRN Nira Conn A, NP   650 mg at 06/02/18 0402  . alum & mag hydroxide-simeth (MAALOX/MYLANTA) 200-200-20 MG/5ML suspension 30 mL  30 mL Oral Q4H PRN Nira Conn A, NP      . ARIPiprazole (ABILIFY) tablet 5 mg  5 mg Oral Daily Nira Conn A, NP   5 mg at 06/02/18 1120  . estradiol (ESTRACE) tablet 2 mg  2 mg Oral Daily Nira Conn A, NP   2 mg at 06/02/18 1119  . gabapentin (NEURONTIN)  capsule 100 mg  100 mg Oral BID PRN Nira Conn A, NP      . hydrOXYzine (ATARAX/VISTARIL) tablet 50 mg  50 mg Oral TID PRN Nira Conn A, NP   50 mg at 06/02/18 0402  . [START ON 06/03/2018] Influenza vac split quadrivalent PF (FLUARIX) injection 0.5 mL  0.5 mL Intramuscular Tomorrow-1000 Malvin Johns, MD      . magnesium hydroxide (MILK OF MAGNESIA) suspension 30 mL  30 mL Oral Daily PRN Jackelyn Poling, NP      . Melene Muller ON 06/03/2018] pneumococcal 23 valent vaccine (PNU-IMMUNE) injection 0.5 mL  0.5 mL Intramuscular Tomorrow-1000 Malvin Johns, MD      . traZODone (DESYREL) tablet 50 mg  50 mg Oral QHS PRN Nira Conn A, NP      . venlafaxine XR (EFFEXOR-XR) 24 hr capsule 75 mg  75 mg Oral Q breakfast Nira Conn A, NP   75 mg at 06/02/18 1121   PTA Medications: Medications Prior to Admission  Medication Sig Dispense Refill Last Dose  . estradiol (ESTRACE) 1 MG tablet Take 2 mg by mouth daily.     Marland Kitchen amitriptyline (ELAVIL) 25 MG tablet Take 25 mg by mouth at bedtime as needed for sleep.     . ARIPiprazole (ABILIFY) 10 MG tablet Take 10 mg by mouth daily.     Marland Kitchen gabapentin (NEURONTIN) 100 MG capsule Take 100 mg by mouth 2 (two) times daily as needed for anxiety.     . hydrOXYzine (ATARAX/VISTARIL) 50 MG tablet Take 50 mg by mouth 3 (three) times daily as needed. for anxiety  3   . venlafaxine XR (EFFEXOR-XR) 150 MG 24 hr capsule Take 150 mg by mouth daily.       Musculoskeletal: Strength & Muscle Tone: within normal limits Gait & Station: normal Patient leans: N/A  Psychiatric Specialty Exam: Physical Exam  Nursing note and vitals reviewed. Constitutional: She appears well-developed and well-nourished.  HENT:  Head: Normocephalic.  Neck: Normal range of motion.  Respiratory: Effort normal.  Musculoskeletal: Normal range of motion.  Neurological: She is alert.  Psychiatric: Her speech is normal and behavior is normal. Thought content normal. She expresses impulsivity. She exhibits a  depressed mood.    Review of Systems  Psychiatric/Behavioral: Positive for depression.  All other systems reviewed and are negative.   Blood pressure (!) 142/88, pulse 67, temperature 98.4 F (36.9 C), temperature source Oral, resp. rate 16, height 6\' 1"  (1.854 m), weight 101.2 kg.Body mass index is 29.42 kg/m.  General Appearance:  Casual  Eye Contact:  Fair  Speech:  Normal Rate, some pauses related to speech apraxiz  Volume:  Normal  Mood:  Depressed  Affect:  Blunt  Thought Process:  Coherent and Descriptions of Associations: Intact  Orientation:  Full (Time, Place, and Person)  Thought Content:  Rumination  Suicidal Thoughts:  No  Homicidal Thoughts:  No  Memory:  Immediate;   Fair Recent;   Fair Remote;   Fair  Judgement:  Fair  Insight:  Fair  Psychomotor Activity:  Decreased  Concentration:  Concentration: Fair and Attention Span: Fair  Recall:  Fair  Fund of Knowledge:  Good  Language:  Good  Akathisia:  No  Handed:  Right  AIMS (if indicated):     Assets:  Housing Leisure Time Physical Health Resilience Social Support  ADL's:  Impaired  Cognition:  WNL  Sleep:  Number of Hours: 1.25    Treatment Plan Summary: Daily contact with patient to assess and evaluate symptoms and progress in treatment, Medication management and Plan major depressive disorder, recurrent, severe without psychosis:  -Restarted Effexor 75 mg daily -Restarted Abilify 5 mg daily  Insomnia: -Started Trazodone 50 mg at bedtime PRN  Anxiety: -Started hydroxyzine 50 mg TID PRN anxiety  Transgender process: -Restarted estrace 1 mg BID  Safety: Will continue 15 minute observation for safety checks. Patient is able to contract for safety on the unit at this time  Labs: Chem WDL, CBC WDL, Lipid panel WDL except HDL 33L, negative for acetaminophen and salicylate and alcohol, positive for cannabis, TSH 1.179  Continue to develop treatment plan to decrease risk of relapse upon discharge  and to reduce the need for readmission.  Psycho-social education regarding relapse prevention and self care.  Health care follow up as needed for medical problems.  Continue to attend and participate in therapy.   Observation Level/Precautions:  15 minute checks  Laboratory:  completed, reviewed, stable  Psychotherapy:  Individual and group therapy  Medications:  See above  Consultations:  none  Discharge Concerns:  none  Estimated LOS: 5-7 days  Other:     Physician Treatment Plan for Primary Diagnosis: Severe recurrent major depression without psychotic features (HCC) Long Term Goal(s): Improvement in symptoms so as ready for discharge  Short Term Goals: Ability to identify changes in lifestyle to reduce recurrence of condition will improve, Ability to verbalize feelings will improve, Ability to disclose and discuss suicidal ideas, Ability to demonstrate self-control will improve, Ability to identify and develop effective coping behaviors will improve, Ability to maintain clinical measurements within normal limits will improve, Compliance with prescribed medications will improve and Ability to identify triggers associated with substance abuse/mental health issues will improve  Physician Treatment Plan for Secondary Diagnosis: Principal Problem:   Severe recurrent major depression without psychotic features (HCC)  Long Term Goal(s): Improvement in symptoms so as ready for discharge  Short Term Goals: Ability to identify changes in lifestyle to reduce recurrence of condition will improve, Ability to verbalize feelings will improve, Ability to disclose and discuss suicidal ideas, Ability to demonstrate self-control will improve, Ability to identify and develop effective coping behaviors will improve, Ability to maintain clinical measurements within normal limits will improve, Compliance with prescribed medications will improve and Ability to identify triggers associated with substance  abuse/mental health issues will improve  I certify that inpatient services furnished can reasonably be expected to improve the patient's condition.    Nanine Means, NP 2/15/20202:46 PM
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
## 2018-06-02 NOTE — [facility] Group Notes (Signed)
[facility] Group Notes:  (Nursing/MHT/Case Management/Adjunct)  Date:  06/02/2018  Time:  4:30 PM  Type of Therapy:  Psychoeducational Skills  Participation Level:  Active  Participation Quality:  Appropriate and Attentive  Affect:  Appropriate  Cognitive:  Alert and Appropriate  Insight:  Appropriate and Good  Engagement in Group:  Engaged  Modes of Intervention:  Discussion and Education  Summary of Progress/Problems:  Discussed Identifying needs.  Patient was attentive and participated.  Audrie Lia Stephana Morell 06/02/2018, 4:30 PM

## 2018-06-02 NOTE — BH Assessment (Addendum)
Assessment Note  Brandon Cox is an 18 y.o. adult who presents to [facility] as a walk in. Pt is male to male transgender and prefers to be called "Brandon Cox." Pt states she has been increasingly suicidal and has racing thoughts of suicide. Pt states she was at work and had thoughts of cutting her wrists. Pt states she tried to stop herself but the thoughts persisted and she ended up using a knife to cut herself. Pt states she has attempted suicide at least 6 times in the past by overdosing on medication several times and trying to hang herself multiple times. Pt states her most recent suicide attempt was about 3 months ago. Pt states she tried to hang herself from her balcony but she was too tall and the rope did not work.   Pt states her suicide attempts are impulsive and reports they are not triggered by any specific event. Pt states she lives at home with her parents who are unsupportive. Pt is not followed by any current MH OPT provider and states the last time she saw a provider was about 1 year ago. Pt states she does not believe that she can keep herself safe and agrees to sign VOL consent for treatment.   Brandon Conn, NP recommends inpt treatment. [facility] accepts pt to [facility].  Diagnosis: MDD, recurrent, severe, w/o psychosis; GAD, severe  Past Medical History:  Past Medical History:  Diagnosis Date  . Depression   . Fracture, cervical vertebra (HCC) 10/22/2015   c6  . History of ITP     History reviewed. No pertinent surgical history.  Family History:  Family History  Problem Relation Age of Onset  . Healthy Mother   . Drug abuse Cousin     Social History:  reports that she has been smoking cigarettes. She has been smoking about 1.00 pack per day. She has never used smokeless tobacco. She reports current drug use. Drug: Marijuana. She reports that she does not drink alcohol.  Additional Social History:  Alcohol / Drug Use Pain Medications: See MAR Prescriptions: See MAR Over the  Counter: See MAR History of alcohol / drug use?: No history of alcohol / drug abuse  CIWA:   COWS:    Allergies: No Known Allergies  Home Medications:  Medications Prior to Admission  Medication Sig Dispense Refill  . estradiol (ESTRACE) 1 MG tablet Take 2 mg by mouth daily.    Marland Kitchen amitriptyline (ELAVIL) 25 MG tablet Take 25 mg by mouth at bedtime as needed for sleep.    . ARIPiprazole (ABILIFY) 10 MG tablet Take 10 mg by mouth daily.    . fluticasone (FLONASE) 50 MCG/ACT nasal spray Place 1 spray into both nostrils daily. 16 g 0  . gabapentin (NEURONTIN) 100 MG capsule Take 100 mg by mouth 2 (two) times daily as needed for anxiety.    . hydrOXYzine (ATARAX/VISTARIL) 50 MG tablet Take 50 mg by mouth 3 (three) times daily as needed. for anxiety  3  . traZODone (DESYREL) 50 MG tablet Take 1 tablet (50 mg total) by mouth at bedtime as needed for sleep. 30 tablet 0  . venlafaxine XR (EFFEXOR-XR) 150 MG 24 hr capsule Take 150 mg by mouth daily.      OB/GYN Status:  No LMP recorded.  General Assessment Data Location of Assessment: [facility] TTS Assessment: In system Is this a Tele or Face-to-Face Assessment?: Face-to-Face Is this an Initial Assessment or a Re-assessment for this encounter?: Initial Assessment Patient Accompanied  by:: N/A Language Other than English: No Living Arrangements: Other (Comment) What gender do you identify as?: Male(male to male) Marital status: Single Pregnancy Status: No Living Arrangements: Parent Can pt return to current living arrangement?: Yes Admission Status: Voluntary Is patient capable of signing voluntary admission?: Yes Referral Source: Self/Family/Friend Insurance type: none  Medical Screening Exam [facility] Walk[facility] ONLY) Medical Exam completed: Yes  Crisis Care Plan Living Arrangements: Parent Name of Psychiatrist: none Name of Therapist: none  Education Status Is patient currently in school?: No Is the patient employed,  unemployed or receiving disability?: Employed  Risk to self with the past 6 months Suicidal Ideation: Yes-Currently Present Has patient been a risk to self within the past 6 months prior to admission? : Yes Suicidal Intent: Yes-Currently Present Has patient had any suicidal intent within the past 6 months prior to admission? : Yes Is patient at risk for suicide?: Yes Suicidal Plan?: Yes-Currently Present Has patient had any suicidal plan within the past 6 months prior to admission? : Yes Specify Current Suicidal Plan: pt has thoughts of cutting his wrists Access to Means: Yes Specify Access to Suicidal Means: pt has access to knives What has been your use of drugs/alcohol within the last 12 months?: denies use  Previous Attempts/Gestures: Yes How many times?: 6 Other Self Harm Risks: hx of suicide attempts, depression, anxiety, no MH treatment  Triggers for Past Attempts: Other personal contacts, Family contact Intentional Self Injurious Behavior: Cutting Comment - Self Injurious Behavior: hx of cutting  Family Suicide History: No Recent stressful life event(s): Trauma (Comment), Turmoil (Comment)(parents not supportive) Persecutory voices/beliefs?: No Depression: Yes Depression Symptoms: Despondent, Insomnia, Tearfulness, Isolating, Guilt, Fatigue, Loss of interest in usual pleasures, Feeling worthless/self pity Substance abuse history and/or treatment for substance abuse?: No Suicide prevention information given to non-admitted patients: Not applicable  Risk to Others within the past 6 months Homicidal Ideation: No Does patient have any lifetime risk of violence toward others beyond the six months prior to admission? : No Thoughts of Harm to Others: No Current Homicidal Intent: No Current Homicidal Plan: No Access to Homicidal Means: No History of harm to others?: No Assessment of Violence: None Noted Does patient have access to weapons?: No Criminal Charges Pending?: No Does  patient have a court date: No Is patient on probation?: No  Psychosis Hallucinations: None noted Delusions: None noted  Mental Status Report Appearance/Hygiene: Disheveled Eye Contact: Good Motor Activity: Freedom of movement Speech: Slurred Level of Consciousness: Alert Mood: Depressed, Anxious, Sad, Sullen, Helpless Affect: Anxious, Depressed, Sad, Sullen Anxiety Level: None Thought Processes: Coherent, Relevant Judgement: Impaired Orientation: Person, Place, Situation, Appropriate for developmental age, Time Obsessive Compulsive Thoughts/Behaviors: None  Cognitive Functioning Concentration: Normal Memory: Remote Intact, Recent Intact Is patient IDD: No Insight: Poor Impulse Control: Poor Appetite: Fair Have you had any weight changes? : No Change Sleep: Decreased Total Hours of Sleep: 4 Vegetative Symptoms: None  ADLScreening [facility] Assessment Services) Patient's cognitive ability adequate to safely complete daily activities?: Yes Patient able to express need for assistance with ADLs?: Yes Independently performs ADLs?: Yes (appropriate for developmental age)  Prior Inpatient Therapy Prior Inpatient Therapy: Yes Prior Therapy Dates: 2019, 2018 [facility] Reason for Treatment: MDD  Prior Outpatient Therapy Prior Outpatient Therapy: Yes Prior Therapy Dates: 2018 Prior Therapy Facilty/Provider(s): [facility]  Reason for Treatment: DEPRESSION Does patient have an ACCT team?: No Does patient have Intensive In-House Services?  : No Does patient have [facility] services? : No  Does patient have P4CC services?: No  ADL Screening (condition at time of admission) Patient's cognitive ability adequate to safely complete daily activities?: Yes Is the patient deaf or have difficulty hearing?: No Does the patient have difficulty seeing, even when wearing glasses/contacts?: No Does the patient have difficulty concentrating, remembering, or  making decisions?: No Patient able to express need for assistance with ADLs?: Yes Does the patient have difficulty dressing or bathing?: No Independently performs ADLs?: Yes (appropriate for developmental age) Does the patient have difficulty walking or climbing stairs?: No Weakness of Legs: None Weakness of Arms/Hands: None  Home Assistive Devices/Equipment Home Assistive Devices/Equipment: None    Abuse/Neglect Assessment (Assessment to be complete while patient is alone) Abuse/Neglect Assessment Can Be Completed: Yes Physical Abuse: Denies Verbal Abuse: Denies Sexual Abuse: Denies Exploitation of patient/patient's resources: Denies Self-Neglect: Denies     Merchant navy officer (For Healthcare) Does Patient Have a Medical Advance Directive?: No Would patient like information on creating a medical advance directive?: No - Patient declined          Disposition:  Brandon Conn, NP recommends inpt treatment. [facility] accepts pt to [facility].   Disposition Initial Assessment Completed for this Encounter: Yes Disposition of Patient: Admit Type of inpatient treatment program: Adult Patient refused recommended treatment: No Mode of transportation if patient is discharged/movement?: N/A  On Site Evaluation by:   Reviewed with Physician:    Karolee Ohs 06/02/2018 3:42 AM

## 2018-06-02 NOTE — [facility] Group Notes (Signed)
[facility] Group Notes: (Clinical Social Work)   06/02/2018      Type of Therapy:  Group Therapy   Participation Level:  Did Not Attend despite MHT prompting   Camey Edell Grossman-Orr, LCSW 06/02/2018, 12:13 PM     

## 2018-06-03 MED ORDER — QUETIAPINE FUMARATE 50 MG PO TABS: 50.0000 mg | ORAL_TABLET | Freq: Every day | ORAL | Status: DC

## 2018-06-03 MED ORDER — NICOTINE 21 MG/24HR TD PT24
21.0000 mg | MEDICATED_PATCH | Freq: Every day | TRANSDERMAL | Status: DC
  Administered 2018-06-04: 21 mg via TRANSDERMAL
  Filled 2018-06-03 (×3): qty 1

## 2018-06-03 MED ORDER — NICOTINE 21 MG/24HR TD PT24
MEDICATED_PATCH | TRANSDERMAL | Status: AC
  Filled 2018-06-03: qty 1

## 2018-06-03 MED ORDER — NICOTINE 21 MG/24HR TD PT24
21.0000 mg | MEDICATED_PATCH | Freq: Every day | TRANSDERMAL | Status: DC
  Administered 2018-06-03: 21 mg via TRANSDERMAL
  Filled 2018-06-03 (×2): qty 1

## 2018-06-03 MED ORDER — OXCARBAZEPINE 150 MG PO TABS
150.0000 mg | ORAL_TABLET | Freq: Two times a day (BID) | ORAL | Status: DC
  Administered 2018-06-03 – 2018-06-06 (×6): 150 mg via ORAL
  Filled 2018-06-03 (×7): qty 1
  Filled 2018-06-03: qty 14
  Filled 2018-06-03 (×3): qty 1
  Filled 2018-06-03: qty 14

## 2018-06-03 MED ORDER — QUETIAPINE FUMARATE 50 MG PO TABS
50.0000 mg | ORAL_TABLET | Freq: Once | ORAL | Status: AC
  Administered 2018-06-03: 50 mg via ORAL
  Filled 2018-06-03 (×2): qty 1

## 2018-06-03 NOTE — [facility] Group Notes (Signed)
[facility] LCSW Group Therapy Note  06/03/2018  10:00-11:00AM  Type of Therapy and Topic:  Group Therapy:  Adding Supports Including Being Your Own Support  Participation Level:  Active   Description of Group:  Patients in this group were introduced to the concept that additional supports including self-support are an essential part of recovery.  A song entitled "I Need Help!" was played and a group discussion was held in reaction to the idea of needing to add supports.  A song entitled "My Own Hero" was played and a group discussion ensued in which patients stated they could relate to the song and it inspired them to realize they have be willing to help themselves in order to succeed, because other people cannot achieve sobriety or stability for them.  We discussed adding a variety of healthy supports to address the various needs in their lives.  A song was played called "I Know Where I've Been" toward the end of group and used to conduct an inspirational wrap-up to group of remembering how far they have already come in their journey.  Therapeutic Goals: 1)  demonstrate the importance of being a part of one's own support system 2)  discuss reasons people in one's life may eventually be unable to be continually supportive  3)  identify the patient's current support system and   4)  elicit commitments to add healthy supports and to become more conscious of being self-supportive   Summary of Patient Progress:  The patient expressed that she has friends and co-workers, a Production designer, theatre/television/film, and "the guy I'm talking to" who are healthy supports, but her unhealthy supports include her family and herself.  She did not talk anymore although she did listen attentively throughout.   Therapeutic Modalities:   Motivational Interviewing Activity  Lynnell Chad

## 2018-06-03 NOTE — Progress Notes (Addendum)
D: Pt was in dayroom upon initial approach.  Pt presents with anxious, depressed affect and mood.  When asked how pt's day was, pt states "I can't complain."  Goal today was "to stay in the dayroom and actually talk to people."  Pt denies SI/HI, denies hallucinations, reports back pain of 3/10.  Pt has been visible in milieu interacting with peers and staff appropriately.  Pt attended evening group.  Pt requests medication for sleep and reports Trazodone was ineffective last night.  A: Introduced self to pt.  Met with pt 1:1.  Actively listened to pt and offered support and encouragement.  Medications administered per order.  PRN medication administered for anxiety and pain.  Q15 minute safety checks maintained.  On-site provider notified of pt request and Seroquel 50 mg POX1 was ordered and administered.   R: Pt is safe on the unit.  Pt is compliant with medications.  Pt verbally contracts for safety.  Will continue to monitor and assess.

## 2018-06-03 NOTE — [facility] Counselor (Signed)
Adult Comprehensive Assessment  Patient ID: KINKADE JACO, adult   DOB: [birth year], 18 y.o.   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  Is scared of passing the course and hurting somebody his first day on the job.  Is scared of it being in conflict with his work schedule. Employment / Job issues: Denies stressors but is barely getting 30 hours a week. Family Relationships: Parents and patient mostly yell at each other.  They decided to take him off their insurance. Financial / Lack of resources (include bankruptcy): Never enough money, due to so many bills. Housing / Lack of housing: Living with parents and all they do is Archivist. Physical health (include injuries & life threatening diseases): Pain in neck and pain shooting up back from accident in 2017. Social relationships: Big groups are anxiety-provoking. Substance abuse: Denies stressors Bereavement / Loss: Denies stressors  Living/Environment/Situation:  Living Arrangements: Parent Living conditions (as described by patient or guardian): Okay, has her own room but no door.  A lot of projects are started and not finished. Who else lives in the home?: Mother, father How long has patient lived in current situation?: Whole life What is atmosphere in current home: Chaotic, Other (Comment)  Family History:  Marital status: Single Are you sexually active?: Yes What is your sexual orientation?: Identifies as transgender, prefers males Has your sexual activity been affected by drugs, alcohol, medication, or emotional stress?: None Does patient have children?: No  Childhood  History:  By whom was/is the patient raised?: Both parents Additional childhood history information: Born in [place] Description of patient's relationship with caregiver when they were a child: Mother: mother's boy.  we always got along.  i couldn't keep a secret from her.  Dad: we would fight sometimes but we still loved each other Patient's description of current relationship with people who raised him/her: Mother - a lot of fighting; Father - also a lot of fighting. How were you disciplined when you got in trouble as a child/adolescent?: stand in corner, spanking, (no discipline as a teenager) Does patient have siblings?: Yes Number of Siblings: 2 Description of patient's current relationship with siblings: 2 sisters - don't talk much. Did patient suffer any verbal/emotional/physical/sexual abuse as a child?: Yes(Verbal by sister's friends due to speech disorder) Did patient suffer from severe childhood neglect?: No Has patient ever been sexually abused/assaulted/raped as an adolescent or adult?: No Was the patient ever a victim of a crime or a disaster?: No Witnessed domestic violence?: No Has patient been effected by domestic violence as an adult?: No  Education:  Highest grade of school patient has completed: Some college Currently a student?: Yes Name of school: [employer] starting in May How long has the patient attended?: Will start in May Learning disability?: No  Employment/Work Situation:   Employment situation: Employed 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  Has been slacking on his job. 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?: (No [employer]) Are There Guns or Other Weapons in Your Home?: Yes Types of Guns/Weapons: [name] has a rifle, father has a rifle. Are These Weapons Safely Secured?: Yes(Locked up, and pt has no access)  Financial Resources:   Financial resources: Income from employment Does patient have a representative payee or guardian?: No  Alcohol/Substance Abuse:   What has been your use of drugs/alcohol within the last 12 months?: Denies use Alcohol/Substance Abuse Treatment Hx: Denies past history Has alcohol/substance abuse ever caused legal problems?: No  Social Support System:   Conservation officer, nature Support System: Fair Describe Community Support System: Conservator, museum/gallery, co-workers, Production designer, theatre/television/film, and a "guy I've been talking to" Type of faith/religion: Pagan How does patient's faith help to cope with current illness?: Prayer helps him get signs and that makes him feel better.  Leisure/Recreation:   Leisure and Hobbies: reading, writing, spending time with friends, watching Netflix  Strengths/Needs:   What is the patient's perception of their strengths?: "I don't know" Patient states they can use these personal strengths during their treatment to contribute to their recovery: N/A Patient states these barriers may affect/interfere with their treatment: None Patient states these barriers may affect their return to the community: None Other important information patient would like considered in planning for their treatment: None  Discharge Plan:   Currently receiving community mental health services: Yes (From Whom)([facility] - psychiatrist) Patient states concerns and preferences for aftercare planning are: Is interested in [facility] Community Treatment Team in [place] Patient states they will know when they are safe and ready for discharge when: When she is more focused instead of "all over the place" and when  she feels like "my old self." Does patient have access to transportation?: Yes(Car is in [facility] parking lot.) Does patient have financial barriers related to discharge medications?: Yes Patient description of barriers related to discharge medications: Limited income, no insurance currently Plan for living situation after discharge: Is interested in moving out of parents' home, but can return there if needed. Will patient be returning to same living situation after discharge?: No  Summary/Recommendations:   Summary and Recommendations (to be completed by the evaluator): Patient is a Irena Reichmann transgender male-to-male admitted with racing, suicidal thoughts with a plan to cut her wrists, some actual self-harm to wrists, and 6 prior suicide attempts by overdose and hanging, including an attempt 3 months ago.  Primary stressors include conflictual relationships with parents with whom she lives, financial problems, lack of insurance and means to continue her hormone therapy, chronic back and neck pain, and upcoming classes to become an EMT that frighten her.  She denies substance use.  This is her 4th hospitalization since October 2018 and she is possibly interested in ACTT services.   Patient will benefit from crisis stabilization, medication evaluation, group therapy and psychoeducation, in addition to case management for discharge planning. At discharge it is recommended that Patient adhere to the established discharge plan and continue in treatment.  Lynnell Chad. 06/03/2018

## 2018-06-03 NOTE — Progress Notes (Addendum)
D. Pt presents with an anxious affect and mood- friendly upon approach- calm and cooperative behavior-Per pt's self inventory, pt rates her depression, hopelessness and anxiety a 2/3/4, respectively. Pt writes that his most important goal today is "interact with people" and "stay in dayroom". Pt  currently denies SI/HI and AVH and agrees to contact staff before acting on any harmful thoughts.  A. Labs and vitals monitored. Pt compliant with medications. Pt supported emotionally and encouraged to express concerns and ask questions.   R. Pt remains safe with 15 minute checks. Will continue POC.

## 2018-06-03 NOTE — Progress Notes (Signed)
[facility] MD Progress Note  06/03/2018 1:47 PM Brandon Cox  MRN:  [id number]  Subjective: Brandon Cox (as he liked to be called) reports, "I was feeling very depressed over the last 2 weeks. The depression got worst. I found a knife at work, thought about cutting myself with it. I have not really cut on myself since high school. I lost my insurance 2 weeks ago, unable to afford my my medicines any more. So, I have not been on my medicines in 2 weeks. I came to the hospital to get back on those medicines. I will need something for sleep. I did not sleep much last night".  (Per admission notes): Patient is a 18 year old transgender male to male who presented as a walk-in to the behavioral health hospital on 06/02/2018 with suicidal ideation. The patient has a past psychiatric history significant for major depression and was last admitted to our facility on 08/14/2017. She was in the hospital for 4 days at that time. She was discharged on Abilify 5 mg p.o. daily, estradiol 1 mg p.o. twice daily, trazodone 50 mg p.o. nightly and venlafaxine 75 mg p.o. daily. The patient stated that she was doing well, but unfortunately over the last several months had lost her insurance. After she lost her insurance she was unable to obtain her psychiatric medicines as well as her hormonal medicines for her transgender transition. She stated that things over the last week or 2 has worsened. She was having thoughts of self-harm. Prior to being admitted to the hospital last night she was thinking about cutting herself. The decision was made because of the worsening depression that she should be admitted to the hospital for evaluation and stabilization. She denied any recent additional stressors. It should be noted that the patient's family is not supportive of her transgender process.  Objective: Patient is seen, chart reviewed. The chart findings discussed with the treatment. Patient presents alert, oriented & aware of situation.  He continues to endorse depression & suicidal thoughts. However, he is able to contract for safety. He is visible on the unit, attending group sessions. He denies any medical issues. He is requesting something for sleep as he did not sleep well last night. Started on Trileptal 150 mg po bid for mood stabilization.  Principal Problem: Severe recurrent major depression without psychotic features (HCC)  Diagnosis: Principal Problem:   Severe recurrent major depression without psychotic features (HCC)  Total Time spent with patient: 25 minutes  Past Psychiatric History: Major depressive disorder.  Past Medical History:  Past Medical History:  Diagnosis Date  . Depression   . Fracture, cervical vertebra (HCC) 10/22/2015   c6  . History of ITP    History reviewed. No pertinent surgical history.  Family History:  Family History  Problem Relation Age of Onset  . Healthy Mother   . Drug abuse Cousin    Family Psychiatric  History: See H&P  Social History:  Social History   Substance and Sexual Activity  Alcohol Use No  . Frequency: Never     Social History   Substance and Sexual Activity  Drug Use Yes  . Types: Marijuana   Comment: used a couple of times in the last couple weeks    Social History   Socioeconomic History  . Marital status: Single    Spouse name: Not on file  . Number of children: 0  . Years of education: Not on file  . Highest education level: High school graduate  Occupational History  .  Occupation: [employer]    Comment: part time  Social Needs  . Financial resource strain: Very hard  . Food insecurity:    Worry: Never true    Inability: Never true  . Transportation needs:    Medical: No    Non-medical: No  Tobacco Use  . Smoking status: Current Every Day Smoker    Packs/day: 1.00    Types: Cigarettes  . Smokeless tobacco: Never Used  . Tobacco comment: 1 pack per week  Substance and Sexual Activity  . Alcohol use: No    Frequency: Never   . Drug use: Yes    Types: Marijuana    Comment: used a couple of times in the last couple weeks  . Sexual activity: Not Currently  Lifestyle  . Physical activity:    Days per week: 0 days    Minutes per session: 0 min  . Stress: Very much  Relationships  . Social connections:    Talks on phone: Once a week    Gets together: More than three times a week    Attends religious service: Never    Active member of club or organization: No    Attends meetings of clubs or organizations: Never    Relationship status: Never married  Other Topics Concern  . Not on file  Social History Narrative  . Not on file   Additional Social History:    Pain Medications: See MAR Prescriptions: See MAR Over the Counter: See MAR History of alcohol / drug use?: No history of alcohol / drug abuse  Sleep: Fair  Appetite:  Good  Current Medications: Current Facility-Administered Medications  Medication Dose Route Frequency Provider Last Rate Last Dose  . acetaminophen (TYLENOL) tablet 650 mg  650 mg Oral Q6H PRN Nira ConnBerry, Jason A, NP   650 mg at 06/03/18 1111  . alum & mag hydroxide-simeth (MAALOX/MYLANTA) 200-200-20 MG/5ML suspension 30 mL  30 mL Oral Q4H PRN Nira ConnBerry, Jason A, NP      . ARIPiprazole (ABILIFY) tablet 5 mg  5 mg Oral Daily Nira ConnBerry, Jason A, NP   5 mg at 06/03/18 0750  . estradiol (ESTRACE) tablet 2 mg  2 mg Oral Daily Nira ConnBerry, Jason A, NP   2 mg at 06/03/18 0747  . gabapentin (NEURONTIN) capsule 100 mg  100 mg Oral BID PRN Nira ConnBerry, Jason A, NP   100 mg at 06/02/18 2208  . hydrOXYzine (ATARAX/VISTARIL) tablet 50 mg  50 mg Oral TID PRN Nira ConnBerry, Jason A, NP   50 mg at 06/02/18 0402  . magnesium hydroxide (MILK OF MAGNESIA) suspension 30 mL  30 mL Oral Daily PRN Nira ConnBerry, Jason A, NP      . traZODone (DESYREL) tablet 50 mg  50 mg Oral QHS PRN Nira ConnBerry, Jason A, NP   50 mg at 06/02/18 2208  . venlafaxine XR (EFFEXOR-XR) 24 hr capsule 75 mg  75 mg Oral Q breakfast Nira ConnBerry, Jason A, NP   75 mg at 06/03/18 16100748    Lab Results:  Results for orders placed or performed during the hospital encounter of 06/02/18 (from the past 48 hour(s))  Urine rapid drug screen (hosp performed)[facility]     Status: Abnormal   Collection Time: 06/02/18  4:06 AM  Result Value Ref Range   Opiates NONE DETECTED NONE DETECTED   Cocaine NONE DETECTED NONE DETECTED   Benzodiazepines NONE DETECTED NONE DETECTED   Amphetamines NONE DETECTED NONE DETECTED   Tetrahydrocannabinol POSITIVE (A) NONE DETECTED   Barbiturates NONE  DETECTED NONE DETECTED    Comment: (NOTE) DRUG SCREEN FOR MEDICAL PURPOSES ONLY.  IF CONFIRMATION IS NEEDED FOR ANY PURPOSE, NOTIFY LAB WITHIN 5 DAYS. LOWEST DETECTABLE LIMITS FOR URINE DRUG SCREEN Drug Class                     Cutoff (ng/mL) Amphetamine and metabolites    1000 Barbiturate and metabolites    200 Benzodiazepine                 200 Tricyclics and metabolites     300 Opiates and metabolites        300 Cocaine and metabolites        300 THC                            50 Performed at [facility], [address], [place], [place] [address]   CBC     Status: None   Collection Time: 06/02/18  6:31 AM  Result Value Ref Range   WBC 9.0 4.0 - 10.5 K/uL   RBC 5.25 4.22 - 5.81 MIL/uL   Hemoglobin 15.5 13.0 - 17.0 g/dL   HCT 60.4 54.0 - 98.1 %   MCV 88.6 80.0 - 100.0 fL   MCH 29.5 26.0 - 34.0 pg   MCHC 33.3 30.0 - 36.0 g/dL   RDW 19.1 47.8 - 29.5 %   Platelets 282 150 - 400 K/uL   nRBC 0.0 0.0 - 0.2 %    Comment: Performed at [facility], [address] [place], [place] [address]  Comprehensive metabolic panel     Status: None   Collection Time: 06/02/18  6:31 AM  Result Value Ref Range   Sodium 139 135 - 145 mmol/L   Potassium 3.7 3.5 - 5.1 mmol/L   Chloride 102 98 - 111 mmol/L   CO2 27 22 - 32 mmol/L   Glucose, Bld 93 70 - 99 mg/dL   BUN 7 6 - 20 mg/dL   Creatinine, Ser 8.65 0.61 - 1.24 mg/dL   Calcium 9.3 8.9 - 78.4 mg/dL   Total  Protein 7.1 6.5 - 8.1 g/dL   Albumin 4.3 3.5 - 5.0 g/dL   AST 22 15 - 41 U/L   ALT 31 0 - 44 U/L   Alkaline Phosphatase 85 38 - 126 U/L   Total Bilirubin 0.9 0.3 - 1.2 mg/dL   GFR calc non Af Amer >60 >60 mL/min   GFR calc Af Amer >60 >60 mL/min   Anion gap 10 5 - 15    Comment: Performed at [facility], [address], [place], [place] [address]  Lipid panel     Status: Abnormal   Collection Time: 06/02/18  6:31 AM  Result Value Ref Range   Cholesterol 117 0 - 200 mg/dL   Triglycerides 88 <528 mg/dL   HDL 33 (L) >41 mg/dL   Total CHOL/HDL Ratio 3.5 RATIO   VLDL 18 0 - 40 mg/dL   LDL Cholesterol 66 0 - 99 mg/dL    Comment:        Total Cholesterol/HDL:CHD Risk Coronary Heart Disease Risk Table                     Men   Women  1/2 Average Risk   3.4   3.3  Average Risk       5.0   4.4  2 X Average Risk   9.6   7.1  3 X Average Risk  23.4   11.0        Use the calculated Patient Ratio above and the CHD Risk Table to determine the patient's CHD Risk.        ATP III CLASSIFICATION (LDL):  <100     mg/dL   Optimal  295-621  mg/dL   Near or Above                    Optimal  130-159  mg/dL   Borderline  308-657  mg/dL   High  >846     mg/dL   Very High Performed at [facility], [address], [place], [place] [address]   TSH     Status: None   Collection Time: 06/02/18  6:31 AM  Result Value Ref Range   TSH 1.179 0.350 - 4.500 uIU/mL    Comment: Performed by a 3rd Generation assay with a functional sensitivity of <=0.01 uIU/mL. Performed at [facility], [address], [place], [place] [address]    Blood Alcohol level:  Lab Results  Component Value Date   ETH <10 02/16/2017   Metabolic Disorder Labs: No results found for: HGBA1C, MPG No results found for: PROLACTIN Lab Results  Component Value Date   CHOL 117 06/02/2018   TRIG 88 06/02/2018   HDL 33 (L) 06/02/2018   CHOLHDL 3.5 06/02/2018   VLDL 18  06/02/2018   LDLCALC 66 06/02/2018   Physical Findings: AIMS: Facial and Oral Movements Muscles of Facial Expression: None, normal Lips and Perioral Area: None, normal Jaw: None, normal Tongue: None, normal,Extremity Movements Upper (arms, wrists, hands, fingers): None, normal Lower (legs, knees, ankles, toes): None, normal, Trunk Movements Neck, shoulders, hips: None, normal, Overall Severity Severity of abnormal movements (highest score from questions above): None, normal Incapacitation due to abnormal movements: None, normal Patient's awareness of abnormal movements (rate only patient's report): No Awareness, Dental Status Current problems with teeth and/or dentures?: No Does patient usually wear dentures?: No  CIWA:    COWS:     Musculoskeletal: Strength & Muscle Tone: within normal limits Gait & Station: normal Patient leans: N/A  Psychiatric Specialty Exam: Physical Exam  Nursing note and vitals reviewed.   Review of Systems  Respiratory: Negative for cough and shortness of breath.   Cardiovascular: Negative for chest pain and palpitations.  Gastrointestinal: Negative for heartburn, nausea and vomiting.  Neurological: Negative for dizziness and headaches.  Psychiatric/Behavioral: Positive for depression, substance abuse and suicidal ideas (UDS (+) for THC). Negative for hallucinations. The patient has insomnia. The patient is not nervous/anxious.     Blood pressure (!) 142/88, pulse 67, temperature 98.4 F (36.9 C), temperature source Oral, resp. rate 16, height 6\' 1"  (1.854 m), weight 101.2 kg.Body mass index is 29.42 kg/m.  General Appearance: Casual  Eye Contact:  Fair  Speech:  Normal Rate  Volume:  Decreased  Mood:  Depressed  Affect:  Congruent  Thought Process:  Coherent and Descriptions of Associations: Intact  Orientation:  Full (Time, Place, and Person)  Thought Content:  Logical  Suicidal Thoughts:  Yes.  without intent/plan  Homicidal Thoughts:  No   Memory:  Immediate;   Fair Recent;   Fair Remote;   Fair  Judgement:  Intact  Insight:  Fair  Psychomotor Activity:  Psychomotor Retardation  Concentration:  Concentration: Fair and Attention Span: Fair  Recall:  Fair  Fund of Knowledge:  Fair  Language:  Fair  Akathisia:  Negative  Handed:  Right  AIMS (if indicated):     Assets:  Communication Skills Desire for Improvement Financial Resources/Insurance Housing Physical Health Resilience Social Support  ADL's:  Intact  Cognition:  WNL  Sleep:  Number of Hours: .25     Treatment Plan Summary: Daily contact with patient to assess and evaluate symptoms and progress in treatment and Medication management.  - Continue inpatient hospitalization.  - Will continue today 06/03/2018 plan as below except where it is noted.  Mood control    - Continue Abilify 5 mg po daily.  Agitation.    - Continue gabapentin 100 mg po bid.  Anxiety.    - Continue Vistaril 50 mg po tid daily.  Mood stabilization.    - Continue Trileptal 150 mg po bid.  Depression.    - Continue Effexor XR 75 mg po daily with breakfast.  Patient to attend & participate in the group sessions.  Discharge disposition is ongoing.  Armandina Stammer, NP, PMHNP, FNP-BC 06/03/2018, 1:47 PM

## 2018-06-04 LAB — HEMOGLOBIN A1C
Hgb A1c MFr Bld: 4.8 % (ref 4.8–5.6)
Mean Plasma Glucose: 91 mg/dL

## 2018-06-04 MED ORDER — LEUPROLIDE ACETATE 3.75 MG IM KIT: 3.7500 mg | PACK | Freq: Once | INTRAMUSCULAR | Status: DC

## 2018-06-04 MED ORDER — GABAPENTIN 300 MG PO CAPS
300.0000 mg | ORAL_CAPSULE | Freq: Two times a day (BID) | ORAL | Status: DC | PRN
  Administered 2018-06-05: 300 mg via ORAL
  Filled 2018-06-04: qty 1
  Filled 2018-06-04: qty 21

## 2018-06-04 MED ORDER — QUETIAPINE FUMARATE 200 MG PO TABS
200.0000 mg | ORAL_TABLET | Freq: Every day | ORAL | Status: DC
  Administered 2018-06-04: 200 mg via ORAL
  Filled 2018-06-04 (×2): qty 1

## 2018-06-04 MED ORDER — IBUPROFEN 400 MG PO TABS
400.0000 mg | ORAL_TABLET | Freq: Four times a day (QID) | ORAL | Status: DC | PRN
  Administered 2018-06-04 – 2018-06-06 (×3): 400 mg via ORAL
  Filled 2018-06-04 (×3): qty 1

## 2018-06-04 MED ORDER — NICOTINE POLACRILEX 2 MG MT GUM
2.0000 mg | CHEWING_GUM | OROMUCOSAL | Status: DC | PRN
  Administered 2018-06-05 – 2018-06-06 (×4): 2 mg via ORAL

## 2018-06-04 MED ORDER — CYCLOBENZAPRINE HCL 10 MG PO TABS
5.0000 mg | ORAL_TABLET | Freq: Three times a day (TID) | ORAL | Status: DC | PRN
  Administered 2018-06-04: 5 mg via ORAL
  Filled 2018-06-04: qty 1

## 2018-06-04 MED ORDER — SPIRONOLACTONE 12.5 MG HALF TABLET
12.5000 mg | ORAL_TABLET | Freq: Every day | ORAL | Status: DC
  Administered 2018-06-04 – 2018-06-06 (×3): 12.5 mg via ORAL
  Filled 2018-06-04 (×3): qty 1
  Filled 2018-06-04: qty 4
  Filled 2018-06-04: qty 1

## 2018-06-04 NOTE — Progress Notes (Signed)
Recreation Therapy Notes  Date:  2.17.20 Time: 0930 Location: [address] Dayroom  Group Topic: Stress Management  Goal Area(s) Addresses:  Patient will identify positive stress management techniques. Patient will identify benefits of using stress management post d/c.  Intervention:  Stress Management  Activity :  Meditation.  LRT introduced the stress management technique of meditation.  LRT played a meditation that focused on impermanence.  Patients were to listen and follow as meditation played to engaged in the activity.   Education:  Stress Management, Discharge Planning.   Education Outcome: Acknowledges Education  Clinical Observations/Feedback: Pt did not attend group.     [name], LRT/CTRS         [name], Breven Guidroz A 06/04/2018 11:13 AM

## 2018-06-04 NOTE — [facility] Group Notes (Signed)
[facility] Group Notes:  (Nursing/MHT/Case Management/Adjunct)  Date:  06/04/2018  Time:  4:57 PM  Type of Therapy:  Psychoeducational Skills  Participation Level:  Active  Participation Quality:  Appropriate and Attentive  Affect:  Appropriate  Cognitive:  Alert and Appropriate  Insight:  Appropriate  Engagement in Group:  Engaged  Modes of Intervention:  Discussion and Education  Summary of Progress/Problems: Discussed Self- care.  Patient was attentive and participated.  Audrie Lia Mishka Stegemann 06/04/2018, 4:57 PM

## 2018-06-04 NOTE — Progress Notes (Signed)
Patient ID: Brandon Cox, adult   DOB: [birth year], 18 y.o.   MRN: [id number]   D: Patient pleasant on approach. Very needy and attention seeking on the unit. Frequently seeks out staff for various reasons. Reports no active SI at present but reports has thoughts "on and off". Interacting well with other peers. A: Staff will monitor on q 15 minute checks, follow treatment plans, and give medications as ordered. R: Cooperative on the unit

## 2018-06-04 NOTE — [facility] Group Notes (Signed)
LCSW Group Therapy Note 06/04/2018 4:04 PM  Type of Therapy and Topic: Group Therapy: Overcoming Obstacles  Participation Level: Active  Description of Group:  In this group patients will be encouraged to explore what they see as obstacles to their own wellness and recovery. They will be guided to discuss their thoughts, feelings, and behaviors related to these obstacles. The group will process together ways to cope with barriers, with attention given to specific choices patients can make. Each patient will be challenged to identify changes they are motivated to make in order to overcome their obstacles. This group will be process-oriented, with patients participating in exploration of their own experiences as well as giving and receiving support and challenge from other group members.  Therapeutic Goals: 1. Patient will identify personal and current obstacles as they relate to admission. 2. Patient will identify barriers that currently interfere with their wellness or overcoming obstacles.  3. Patient will identify feelings, thought process and behaviors related to these barriers. 4. Patient will identify two changes they are willing to make to overcome these obstacles:   Summary of Patient Progress  Brandon Cox was engaged and participated throughout the group session. Brandon Cox reports that her living situation was her main obstacle. Brandon Cox shared that she lives with her parents, however they are not supportive and argue with the patient frequently.     Therapeutic Modalities:  Cognitive Behavioral Therapy Solution Focused Therapy Motivational Interviewing Relapse Prevention Therapy   Giulio Bertino LCSWA Clinical Social Worker .

## 2018-06-04 NOTE — Plan of Care (Signed)
  Problem: Activity: Goal: Sleeping patterns will improve Outcome: Progressing Note:  Slept 6.75 hours last night according to flowsheet.    

## 2018-06-04 NOTE — Progress Notes (Signed)
[facility]  06/04/2018 11:31 AM Brandon ModestRaymond E Cox  MRN:  [id number] Subjective:  Patient is seen and examined. Patient is a 18 year old transgender male to male who presented as a walk-in to the behavioral health hospital on 06/02/2018 with suicidal ideation.   Objective: Patient is seen and examined.  Patient is a 18 year old male to male transgender patient.  She is seen in follow-up.  She stated that she not better yesterday, but feels more depressed acutely this morning.  She also stated that she is having some right-sided neck pain.  The pain had not been relieved with Tylenol alone.  She also stated that she is not sleeping well.  She received Seroquel last night, but the dosage was inadequate.  She also received Trileptal 150 mg twice daily to assist with anxiety as well as sedation.  She continues on Abilify, estradiol, gabapentin and Effexor XR.  Her blood pressure stable this morning, but a bit tachycardic with a rate of 108.  The nursing notes reflect that she slept 6.75 hours last night.  She stated that yesterday she did not have suicidal ideation, but this morning she has.  Principal Problem: Severe recurrent major depression without psychotic features (HCC) Diagnosis: Principal Problem:   Severe recurrent major depression without psychotic features (HCC)  Total Time spent with patient: 15 minutes  Past Psychiatric History: See admission H&P  Past Medical History:  Past Medical History:  Diagnosis Date  . Depression   . Fracture, cervical vertebra (HCC) 10/22/2015   c6  . History of ITP    History reviewed. No pertinent surgical history. Family History:  Family History  Problem Relation Age of Onset  . Healthy Mother   . Drug abuse Cousin    Family Psychiatric  History: See admission H&P Social History:  Social History   Substance and Sexual Activity  Alcohol Use No  . Frequency: Never     Social History   Substance and Sexual Activity  Drug Use Yes   . Types: Marijuana   Comment: used a couple of times in the last couple weeks    Social History   Socioeconomic History  . Marital status: Single    Spouse name: Not on file  . Number of children: 0  . Years of education: Not on file  . Highest education level: High school graduate  Occupational History  . Occupation: [employer]    Comment: part time  Social Needs  . Financial resource strain: Very hard  . Food insecurity:    Worry: Never true    Inability: Never true  . Transportation needs:    Medical: No    Non-medical: No  Tobacco Use  . Smoking status: Current Every Day Smoker    Packs/day: 1.00    Types: Cigarettes  . Smokeless tobacco: Never Used  . Tobacco comment: 1 pack per week  Substance and Sexual Activity  . Alcohol use: No    Frequency: Never  . Drug use: Yes    Types: Marijuana    Comment: used a couple of times in the last couple weeks  . Sexual activity: Not Currently  Lifestyle  . Physical activity:    Days per week: 0 days    Minutes per session: 0 min  . Stress: Very much  Relationships  . Social connections:    Talks on phone: Once a week    Gets together: More than three times a week    Attends religious service: Never  Active member of club or organization: No    Attends meetings of clubs or organizations: Never    Relationship status: Never married  Other Topics Concern  . Not on file  Social History Narrative  . Not on file   Additional Social History:    Pain Medications: See MAR Prescriptions: See MAR Over the Counter: See MAR History of alcohol / drug use?: No history of alcohol / drug abuse                    Sleep: Fair  Appetite:  Fair  Current Medications: Current Facility-Administered Medications  Medication Dose Route Frequency Provider Last Rate Last Dose  . acetaminophen (TYLENOL) tablet 650 mg  650 mg Oral Q6H PRN Nira Conn A, NP   650 mg at 06/04/18 0743  . alum & mag hydroxide-simeth  (MAALOX/MYLANTA) 200-200-20 MG/5ML suspension 30 mL  30 mL Oral Q4H PRN Nira Conn A, NP      . ARIPiprazole (ABILIFY) tablet 5 mg  5 mg Oral Daily Nira Conn A, NP   5 mg at 06/04/18 0800  . estradiol (ESTRACE) tablet 2 mg  2 mg Oral Daily Nira Conn A, NP   2 mg at 06/04/18 0743  . gabapentin (NEURONTIN) capsule 300 mg  300 mg Oral BID PRN Antonieta Pert, MD      . hydrOXYzine (ATARAX/VISTARIL) tablet 50 mg  50 mg Oral TID PRN Nira Conn A, NP   50 mg at 06/04/18 1120  . ibuprofen (ADVIL,MOTRIN) tablet 400 mg  400 mg Oral Q6H PRN Antonieta Pert, MD      . magnesium hydroxide (MILK OF MAGNESIA) suspension 30 mL  30 mL Oral Daily PRN Nira Conn A, NP   30 mL at 06/03/18 1929  . nicotine (NICODERM CQ - dosed in mg/24 hours) patch 21 mg  21 mg Transdermal Daily Antonieta Pert, MD   21 mg at 06/04/18 0743  . OXcarbazepine (TRILEPTAL) tablet 150 mg  150 mg Oral BID Armandina Stammer I, NP   150 mg at 06/04/18 0745  . QUEtiapine (SEROQUEL) tablet 200 mg  200 mg Oral QHS Antonieta Pert, MD      . venlafaxine XR [facility]) 24 hr capsule 75 mg  75 mg Oral Q breakfast Nira Conn A, NP   75 mg at 06/04/18 9735    Lab Results: No results found for this or any previous visit (from the past 48 hour(s)).  Blood Alcohol level:  Lab Results  Component Value Date   ETH <10 02/16/2017    Metabolic Disorder Labs: Lab Results  Component Value Date   HGBA1C 4.8 06/02/2018   MPG 91 06/02/2018   No results found for: PROLACTIN Lab Results  Component Value Date   CHOL 117 06/02/2018   TRIG 88 06/02/2018   HDL 33 (L) 06/02/2018   CHOLHDL 3.5 06/02/2018   VLDL 18 06/02/2018   LDLCALC 66 06/02/2018    Physical Findings: AIMS: Facial and Oral Movements Muscles of Facial Expression: None, normal Lips and Perioral Area: None, normal Jaw: None, normal Tongue: None, normal,Extremity Movements Upper (arms, wrists, hands, fingers): None, normal Lower (legs, knees, ankles, toes):  None, normal, Trunk Movements Neck, shoulders, hips: None, normal, Overall Severity Severity of abnormal movements (highest score from questions above): None, normal Incapacitation due to abnormal movements: None, normal Patient's awareness of abnormal movements (rate only patient's report): No Awareness, Dental Status Current problems with teeth and/or dentures?: No Does patient usually  wear dentures?: No  CIWA:    COWS:     Musculoskeletal: Strength & Muscle Tone: within normal limits Gait & Station: normal Patient leans: N/A  Psychiatric Specialty Exam: Physical Exam  Nursing note and vitals reviewed. Constitutional: She is oriented to person, place, and time. She appears well-developed and well-nourished.  HENT:  Head: Normocephalic and atraumatic.  Respiratory: Effort normal.  Neurological: She is alert and oriented to person, place, and time.    ROS  Blood pressure (!) 136/97, pulse (!) 108, temperature (!) 97.5 F (36.4 C), temperature source Oral, resp. rate 20, height 6\' 1"  (1.854 m), weight 101.2 kg.Body mass index is 29.42 kg/m.  General Appearance: Casual  Eye Contact:  Fair  Speech:  Normal Rate  Volume:  Decreased  Mood:  Depressed  Affect:  Congruent  Thought Process:  Coherent and Descriptions of Associations: Intact  Orientation:  Full (Time, Place, and Person)  Thought Content:  Logical  Suicidal Thoughts:  Yes.  without intent/plan  Homicidal Thoughts:  No  Memory:  Immediate;   Fair Recent;   Fair Remote;   Fair  Judgement:  Intact  Insight:  Fair  Psychomotor Activity:  Psychomotor Retardation  Concentration:  Concentration: Fair and Attention Span: Fair  Recall:  [employer] of Knowledge:  Fair  Language:  Fair  Akathisia:  Negative  Handed:  Right  AIMS (if indicated):     Assets:  Communication Skills Desire for Improvement Housing Physical Health Resilience  ADL's:  Intact  Cognition:  WNL  Sleep:  Number of Hours: 6.75      Treatment Plan Summary: Daily contact with patient to assess and evaluate symptoms and progress in treatment, Medication management and Plan : Patient is seen and examined.  Patient is a 18 year old male to male transgender patient with a past psychiatric history significant for major depression and generalized anxiety disorder who is seen in follow-up.  Sleep continues to be a problem.  We will increase the Seroquel to 200 mg p.o. nightly and see if that helps.  We will continue the Trileptal 150 mg p.o. twice daily.  She also complains of right-sided neck pain, and she is taken the ibuprofen which was written this a.m., as well as the Flexeril.  After the Flexeril she developed some nausea, so I have gone on and stop that.  Her other psychiatric medications including the Abilify and Neurontin will be continued at their current dosage as well as the Effexor at 75 mg p.o. daily.  We may need to increase that in the next few days. 1.  Continue Abilify 5 mg p.o. daily for mood stability. 2.  Continue estradiol 2 mg p.o. daily for gender transition. 3.  Continue gabapentin 300 mg p.o. twice daily as needed anxiety. 4.  Continue hydroxyzine 50 mg p.o. 3 times daily as needed anxiety. 5.  Continue ibuprofen 400 mg p.o. every 6 hours as needed headache, pain or neck pain. 6.  Continue Trileptal 150 mg p.o. twice daily for anxiety as well as mood stability. 7.  Increase Seroquel to 200 mg p.o. nightly for mood stability and sleep. 8.  Continue venlafaxine extended release 75 mg p.o. daily for anxiety and depression. 9.  Disposition planning-in progress.  Antonieta Pert, MD 06/04/2018, 11:31 AM

## 2018-06-04 NOTE — Tx Team (Signed)
Interdisciplinary Treatment and Diagnostic Plan Update  06/04/2018 Time of Session: 9:15am Brandon Cox MRN: [id number]  Principal Diagnosis: Severe recurrent major depression without psychotic features [facility])  Secondary Diagnoses: Principal Problem:   Severe recurrent major depression without psychotic features (HCC)   Current Medications:  Current Facility-Administered Medications  Medication Dose Route Frequency Provider Last Rate Last Dose  . acetaminophen (TYLENOL) tablet 650 mg  650 mg Oral Q6H PRN Nira Conn A, NP   650 mg at 06/04/18 0743  . alum & mag hydroxide-simeth (MAALOX/MYLANTA) 200-200-20 MG/5ML suspension 30 mL  30 mL Oral Q4H PRN Nira Conn A, NP      . ARIPiprazole (ABILIFY) tablet 5 mg  5 mg Oral Daily Nira Conn A, NP   5 mg at 06/04/18 0800  . estradiol (ESTRACE) tablet 2 mg  2 mg Oral Daily Nira Conn A, NP   2 mg at 06/04/18 0743  . gabapentin (NEURONTIN) capsule 100 mg  100 mg Oral BID PRN Nira Conn A, NP   100 mg at 06/03/18 2002  . hydrOXYzine (ATARAX/VISTARIL) tablet 50 mg  50 mg Oral TID PRN Jackelyn Poling, NP   50 mg at 06/03/18 2001  . magnesium hydroxide (MILK OF MAGNESIA) suspension 30 mL  30 mL Oral Daily PRN Nira Conn A, NP   30 mL at 06/03/18 1929  . nicotine (NICODERM CQ - dosed in mg/24 hours) patch 21 mg  21 mg Transdermal Daily Antonieta Pert, MD   21 mg at 06/04/18 0743  . OXcarbazepine (TRILEPTAL) tablet 150 mg  150 mg Oral BID Armandina Stammer I, NP   150 mg at 06/04/18 0745  . venlafaxine XR (EFFEXOR-XR) 24 hr capsule 75 mg  75 mg Oral Q breakfast Nira Conn A, NP   75 mg at 06/04/18 1941   PTA Medications: Medications Prior to Admission  Medication Sig Dispense Refill Last Dose  . estradiol (ESTRACE) 1 MG tablet Take 2 mg by mouth daily.     Marland Kitchen amitriptyline (ELAVIL) 25 MG tablet Take 25 mg by mouth at bedtime as needed for sleep.     . ARIPiprazole (ABILIFY) 10 MG tablet Take 10 mg by mouth daily.     Marland Kitchen gabapentin  (NEURONTIN) 100 MG capsule Take 100 mg by mouth 2 (two) times daily as needed for anxiety.     . hydrOXYzine (ATARAX/VISTARIL) 50 MG tablet Take 50 mg by mouth 3 (three) times daily as needed. for anxiety  3   . venlafaxine XR (EFFEXOR-XR) 150 MG 24 hr capsule Take 150 mg by mouth daily.       Patient Stressors: Financial difficulties Marital or family conflict Medication change or noncompliance  Patient Strengths: Wellsite geologist fund of knowledge Motivation for treatment/growth Physical Health Work skills  Treatment Modalities: Medication Management, Group therapy, Case management,  1 to 1 session with clinician, Psychoeducation, Recreational therapy.   Physician Treatment Plan for Primary Diagnosis: Severe recurrent major depression without psychotic features (HCC) Long Term Goal(s): Improvement in symptoms so as ready for discharge Improvement in symptoms so as ready for discharge   Short Term Goals: Ability to identify changes in lifestyle to reduce recurrence of condition will improve Ability to verbalize feelings will improve Ability to disclose and discuss suicidal ideas Ability to demonstrate self-control will improve Ability to identify and develop effective coping behaviors will improve Ability to maintain clinical measurements within normal limits will improve Compliance with prescribed medications will improve Ability to identify triggers associated with substance  abuse/mental health issues will improve Ability to identify changes in lifestyle to reduce recurrence of condition will improve Ability to verbalize feelings will improve Ability to disclose and discuss suicidal ideas Ability to demonstrate self-control will improve Ability to identify and develop effective coping behaviors will improve Ability to maintain clinical measurements within normal limits will improve Compliance with prescribed medications will improve Ability to identify triggers  associated with substance abuse/mental health issues will improve  Medication Management: Evaluate patient's response, side effects, and tolerance of medication regimen.  Therapeutic Interventions: 1 to 1 sessions, Unit Group sessions and Medication administration.  Evaluation of Outcomes: Progressing  Physician Treatment Plan for Secondary Diagnosis: Principal Problem:   Severe recurrent major depression without psychotic features (HCC)  Long Term Goal(s): Improvement in symptoms so as ready for discharge Improvement in symptoms so as ready for discharge   Short Term Goals: Ability to identify changes in lifestyle to reduce recurrence of condition will improve Ability to verbalize feelings will improve Ability to disclose and discuss suicidal ideas Ability to demonstrate self-control will improve Ability to identify and develop effective coping behaviors will improve Ability to maintain clinical measurements within normal limits will improve Compliance with prescribed medications will improve Ability to identify triggers associated with substance abuse/mental health issues will improve Ability to identify changes in lifestyle to reduce recurrence of condition will improve Ability to verbalize feelings will improve Ability to disclose and discuss suicidal ideas Ability to demonstrate self-control will improve Ability to identify and develop effective coping behaviors will improve Ability to maintain clinical measurements within normal limits will improve Compliance with prescribed medications will improve Ability to identify triggers associated with substance abuse/mental health issues will improve     Medication Management: Evaluate patient's response, side effects, and tolerance of medication regimen.  Therapeutic Interventions: 1 to 1 sessions, Unit Group sessions and Medication administration.  Evaluation of Outcomes: Progressing   RN Treatment Plan for Primary Diagnosis:  Severe recurrent major depression without psychotic features (HCC) Long Term Goal(s): Knowledge of disease and therapeutic regimen to maintain health will improve  Short Term Goals: Ability to participate in decision making will improve, Ability to verbalize feelings will improve, Ability to disclose and discuss suicidal ideas, Ability to identify and develop effective coping behaviors will improve and Compliance with prescribed medications will improve  Medication Management: RN will administer medications as ordered by provider, will assess and evaluate patient's response and provide education to patient for prescribed medication. RN will report any adverse and/or side effects to prescribing provider.  Therapeutic Interventions: 1 on 1 counseling sessions, Psychoeducation, Medication administration, Evaluate responses to treatment, Monitor vital signs and CBGs as ordered, Perform/monitor CIWA, COWS, AIMS and Fall Risk screenings as ordered, Perform wound care treatments as ordered.  Evaluation of Outcomes: Progressing   LCSW Treatment Plan for Primary Diagnosis: Severe recurrent major depression without psychotic features (HCC) Long Term Goal(s): Safe transition to appropriate next level of care at discharge, Engage patient in therapeutic group addressing interpersonal concerns.  Short Term Goals: Engage patient in aftercare planning with referrals and resources  Therapeutic Interventions: Assess for all discharge needs, 1 to 1 time with Social worker, Explore available resources and support systems, Assess for adequacy in community support network, Educate family and significant other(s) on suicide prevention, Complete Psychosocial Assessment, Interpersonal group therapy.  Evaluation of Outcomes: Progressing   Progress in Treatment: Attending groups: Yes. Participating in groups: Yes. Taking medication as prescribed: Yes. Toleration medication: Yes. Family/Significant other contact  made: No,  will contact:  the patient's friend Patient understands diagnosis: Yes. Discussing patient identified problems/goals with staff: Yes. Medical problems stabilized or resolved: Yes. Denies suicidal/homicidal ideation: Yes. Issues/concerns per patient self-inventory: No. Other:   New problem(s) identified: None   New Short Term/Long Term Goal(s): medication stabilization, elimination of SI thoughts, development of comprehensive mental wellness plan.   Patient Goals:  "To learn coping skills so I do not have to come back here"  Discharge Plan or Barriers: [place] He reports he currently follows up with a psychiatrist through [facility]. CSW will continue to follow for appropriate referrals and any additional discharge planning.   Reason for Continuation of Hospitalization: Anxiety Depression Medication stabilization Suicidal ideation  Estimated Length of Stay: 06/06/2018  Attendees: Patient: Brandon Cox 06/04/2018 8:50 AM  Physician: Dr. Landry MellowGreg Clary, MD 06/04/2018 8:50 AM  Nursing: Lanora ManisElizabeth.Val EagleO, RN 06/04/2018 8:50 AM  RN Care Manager: 06/04/2018 8:50 AM  Social Worker: Baldo DaubJolan Christia Coaxum, LCSWA 06/04/2018 8:50 AM  Recreational Therapist:  06/04/2018 8:50 AM  Other: Marciano SequinJanet Sykes, NP 06/04/2018 8:50 AM  Other:  06/04/2018 8:50 AM  Other: 06/04/2018 8:50 AM    Scribe for Treatment Team: Maeola SarahJolan E Quinlan Vollmer, LCSWA 06/04/2018 8:50 AM

## 2018-06-05 MED ORDER — TRAZODONE HCL 50 MG PO TABS
50.0000 mg | ORAL_TABLET | Freq: Every evening | ORAL | Status: DC | PRN
  Administered 2018-06-05 (×2): 50 mg via ORAL
  Filled 2018-06-05 (×2): qty 1
  Filled 2018-06-05: qty 14

## 2018-06-05 MED ORDER — QUETIAPINE FUMARATE 25 MG PO TABS
75.0000 mg | ORAL_TABLET | Freq: Every day | ORAL | Status: DC
  Filled 2018-06-05: qty 3

## 2018-06-05 NOTE — Progress Notes (Signed)
Patient Brandon Cox met with counselor Kerry Hough, MS, [facility], [place]. Pt discussed her enjoyment for helping others and how she does not want others to feel the way she has felt in the past. Pt shared that she feels unsupported by her parents and that her parents will not call her "Brandon Cox" or use she/her pronouns. Pt reported she has anxiety around wearing feminine clothes in public for fear of not knowing how others will react, noting she has a fear of others yelling at her, hurting her, or killing her.   Pt shared she has found support in events such as [employer] and has a group of individuals whom she considers supportive, including coworkers, a partner, and some friends. Pt stated she thinks it would be better if she moved out of her parents home.  Pt reported the medication was making her drowsy and asked to pick up the session at a later date; counselor validated pt's drowsiness and let pt know that counselor is available whenever the pt feels up for continued conversation.  Pt was given counselor's professional disclosure statement and consent to record. Pt signed both forms and counselor explained the protocol.   Kerry Hough, MS, [facility], [place]

## 2018-06-05 NOTE — Progress Notes (Addendum)
[facility] MD Progress Note  06/05/2018 11:42 AM JUVENS MATTON  MRN:  [id number] Subjective:  "I'm feeling drugged from the Seroquel last night."  Ms. Lair sitting in the dayroom. Presents with bright affect and good eye contact but reports sleepiness today from the Seroquel. States she is having trouble focusing and keeping her eyes open today. Per chart review she fell asleep during group therapy this morning. She reports mood is "a lot better" since admission. She reports worsening symptoms prior to admission largely were related to financial issues- limited hours at job as well as lack of insurance for mental health treatment. Her parents are also not supportive of transgender transition. Denies SI today. Denies AVH.  Principal Problem: Severe recurrent major depression without psychotic features (HCC) Diagnosis: Principal Problem:   Severe recurrent major depression without psychotic features (HCC)  Total Time spent with patient: 15 minutes  Past Psychiatric History: See admission H&P  Past Medical History:  Past Medical History:  Diagnosis Date  . Depression   . Fracture, cervical vertebra (HCC) 10/22/2015   c6  . History of ITP    History reviewed. No pertinent surgical history. Family History:  Family History  Problem Relation Age of Onset  . Healthy Mother   . Drug abuse Cousin    Family Psychiatric  History: See admission H&P Social History:  Social History   Substance and Sexual Activity  Alcohol Use No  . Frequency: Never     Social History   Substance and Sexual Activity  Drug Use Yes  . Types: Marijuana   Comment: used a couple of times in the last couple weeks    Social History   Socioeconomic History  . Marital status: Single    Spouse name: Not on file  . Number of children: 0  . Years of education: Not on file  . Highest education level: High school graduate  Occupational History  . Occupation: [employer]    Comment: part time  Social Needs  .  Financial resource strain: Very hard  . Food insecurity:    Worry: Never true    Inability: Never true  . Transportation needs:    Medical: No    Non-medical: No  Tobacco Use  . Smoking status: Current Every Day Smoker    Packs/day: 1.00    Types: Cigarettes  . Smokeless tobacco: Never Used  . Tobacco comment: 1 pack per week  Substance and Sexual Activity  . Alcohol use: No    Frequency: Never  . Drug use: Yes    Types: Marijuana    Comment: used a couple of times in the last couple weeks  . Sexual activity: Not Currently  Lifestyle  . Physical activity:    Days per week: 0 days    Minutes per session: 0 min  . Stress: Very much  Relationships  . Social connections:    Talks on phone: Once a week    Gets together: More than three times a week    Attends religious service: Never    Active member of club or organization: No    Attends meetings of clubs or organizations: Never    Relationship status: Never married  Other Topics Concern  . Not on file  Social History Narrative  . Not on file   Additional Social History:    Pain Medications: See MAR Prescriptions: See MAR Over the Counter: See MAR History of alcohol / drug use?: No history of alcohol / drug abuse  Sleep: Good  Appetite:  Good  Current Medications: Current Facility-Administered Medications  Medication Dose Route Frequency Provider Last Rate Last Dose  . acetaminophen (TYLENOL) tablet 650 mg  650 mg Oral Q6H PRN Nira Conn A, NP   650 mg at 06/04/18 1951  . alum & mag hydroxide-simeth (MAALOX/MYLANTA) 200-200-20 MG/5ML suspension 30 mL  30 mL Oral Q4H PRN Nira Conn A, NP      . ARIPiprazole (ABILIFY) tablet 5 mg  5 mg Oral Daily Nira Conn A, NP   5 mg at 06/05/18 0737  . estradiol (ESTRACE) tablet 2 mg  2 mg Oral Daily Nira Conn A, NP   2 mg at 06/05/18 0737  . gabapentin (NEURONTIN) capsule 300 mg  300 mg Oral BID PRN Antonieta Pert, MD      . hydrOXYzine  (ATARAX/VISTARIL) tablet 50 mg  50 mg Oral TID PRN Jackelyn Poling, NP   50 mg at 06/04/18 2238  . ibuprofen (ADVIL,MOTRIN) tablet 400 mg  400 mg Oral Q6H PRN Antonieta Pert, MD   400 mg at 06/04/18 2048  . leuprolide (LUPRON) injection 3.75 mg  3.75 mg Intramuscular Once Antonieta Pert, MD      . magnesium hydroxide (MILK OF MAGNESIA) suspension 30 mL  30 mL Oral Daily PRN Nira Conn A, NP   30 mL at 06/03/18 1929  . nicotine polacrilex (NICORETTE) gum 2 mg  2 mg Oral PRN Nira Conn A, NP   2 mg at 06/05/18 0740  . OXcarbazepine (TRILEPTAL) tablet 150 mg  150 mg Oral BID Armandina Stammer I, NP   150 mg at 06/05/18 0737  . QUEtiapine (SEROQUEL) tablet 200 mg  200 mg Oral QHS Antonieta Pert, MD   200 mg at 06/04/18 2122  . spironolactone (ALDACTONE) tablet 12.5 mg  12.5 mg Oral Daily Antonieta Pert, MD   12.5 mg at 06/05/18 0737  . venlafaxine XR (EFFEXOR-XR) 24 hr capsule 75 mg  75 mg Oral Q breakfast Nira Conn A, NP   75 mg at 06/05/18 9381    Lab Results: No results found for this or any previous visit (from the past 48 hour(s)).  Blood Alcohol level:  Lab Results  Component Value Date   ETH <10 02/16/2017    Metabolic Disorder Labs: Lab Results  Component Value Date   HGBA1C 4.8 06/02/2018   MPG 91 06/02/2018   No results found for: PROLACTIN Lab Results  Component Value Date   CHOL 117 06/02/2018   TRIG 88 06/02/2018   HDL 33 (L) 06/02/2018   CHOLHDL 3.5 06/02/2018   VLDL 18 06/02/2018   LDLCALC 66 06/02/2018    Physical Findings: AIMS: Facial and Oral Movements Muscles of Facial Expression: None, normal Lips and Perioral Area: None, normal Jaw: None, normal Tongue: None, normal,Extremity Movements Upper (arms, wrists, hands, fingers): None, normal Lower (legs, knees, ankles, toes): None, normal, Trunk Movements Neck, shoulders, hips: None, normal, Overall Severity Severity of abnormal movements (highest score from questions above): None,  normal Incapacitation due to abnormal movements: None, normal Patient's awareness of abnormal movements (rate only patient's report): No Awareness, Dental Status Current problems with teeth and/or dentures?: No Does patient usually wear dentures?: No  CIWA:    COWS:     Musculoskeletal: Strength & Muscle Tone: within normal limits Gait & Station: normal Patient leans: N/A  Psychiatric Specialty Exam: Physical Exam  Nursing note and vitals reviewed. Constitutional: She is oriented to person, place, and time. She  appears well-developed and well-nourished.  Respiratory: Effort normal.  Neurological: She is alert and oriented to person, place, and time.    Review of Systems  Constitutional: Negative.   Psychiatric/Behavioral: Positive for depression and substance abuse (UDS +THC). Negative for hallucinations, memory loss and suicidal ideas. The patient is not nervous/anxious and does not have insomnia.     Blood pressure 128/79, pulse (!) 112, temperature 98.7 F (37.1 C), temperature source Oral, resp. rate 20, height 6\' 1"  (1.854 m), weight 101.2 kg.Body mass index is 29.42 kg/m.  General Appearance: Casual  Eye Contact:  Good  Speech:  Normal Rate  Volume:  Normal  Mood:  Euthymic  Affect:  Congruent and Full Range  Thought Process:  Coherent  Orientation:  Full (Time, Place, and Person)  Thought Content:  WDL  Suicidal Thoughts:  No  Homicidal Thoughts:  No  Memory:  Immediate;   Good Recent;   Good  Judgement:  Fair  Insight:  Fair  Psychomotor Activity:  Normal  Concentration:  Concentration: Good  Recall:  Good  Fund of Knowledge:  Fair  Language:  Good  Akathisia:  No  Handed:  Right  AIMS (if indicated):     Assets:  Communication Skills Desire for Improvement Physical Health Resilience Social Support  ADL's:  Intact  Cognition:  WNL  Sleep:  Number of Hours: 6.5     Treatment Plan Summary: Daily contact with patient to assess and evaluate symptoms  and progress in treatment and Medication management   Continue inpatient hospitalization.  Discontinue Seroquel due to sedation, second antipsychotic Start trazodone 50 mg PO QHS PRN insomnia Continue Trileptal 150 mg PO BID for mood Continue Abilify 5 mg PO daily for mood Continue Effexor XR 75 mg PO daily for mood Continue Vistaril 50 mg PO TID PRN anxiety Continue gabapentin 300 mg PO BID PRN anxiety Continue estradiol 2 mg PO daily for gender transition Continue Aldactone 12.5 mg PO daily for gender transition  Patient will participate in the therapeutic group milieu.  Discharge disposition in progress.   Aldean BakerJanet E Sykes, NP 06/05/2018, 11:42 AM   Agree with NP progress note

## 2018-06-05 NOTE — Progress Notes (Signed)
Recreation Therapy Notes  Animal-Assisted Activity (AAA) Program Checklist/Progress Notes Patient Eligibility Criteria Checklist & Daily Group note for Rec Tx Intervention  Date: 2.18. 20 Time: 1430 Location: 400 Hall Dayroom  AAA/T Program Assumption of Risk Form signed by Patient/ or Parent Legal Guardian  YES   Patient is free of allergies or sever asthma   YES   Patient reports no fear of animals  YES   Patient reports no history of cruelty to animals  YES   Patient understands his/her participation is voluntary  YES   Patient washes hands before animal contact  YES  Patient washes hands after animal contact  YES   Behavioral Response: Engaged  Education: Charity fundraiser, Appropriate Animal Interaction   Education Outcome: Acknowledges understanding/In group clarification offered/Needs additional education.   Clinical Observations/Feedback: Pt attended and participated in group activity.    [name], LRT/CTRS         [name] A 06/05/2018 2:59 PM

## 2018-06-05 NOTE — BHH Group Notes (Signed)
LCSW Group Therapy Note 06/05/2018 3:24 PM  Type of Therapy/Topic: Group Therapy: Feelings about Diagnosis  Participation Level: Minimal   Description of Group:  This group will allow patients to explore their thoughts and feelings about diagnoses they have received. Patients will be guided to explore their level of understanding and acceptance of these diagnoses. Facilitator will encourage patients to process their thoughts and feelings about the reactions of others to their diagnosis and will guide patients in identifying ways to discuss their diagnosis with significant others in their lives. This group will be process-oriented, with patients participating in exploration of their own experiences, giving and receiving support, and processing challenge from other group members.  Therapeutic Goals: 1. Patient will demonstrate understanding of diagnosis as evidenced by identifying two or more symptoms of the disorder 2. Patient will be able to express two feelings regarding the diagnosis 3. Patient will demonstrate their ability to communicate their needs through discussion and/or role play  Summary of Patient Progress:  Brandon Cox was attentive, however he did not contribute to the group discussion.       Therapeutic Modalities:  Cognitive Behavioral Therapy Brief Therapy Feelings Identification    Brandon Cox [name] Clinical Social Worker

## 2018-06-05 NOTE — Progress Notes (Signed)
Patient ID: Brandon Cox, adult   DOB: [birth year], 18 y.o.   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 Patient may be escorted by staff for a few minutes but then is expected to return to recreation time with peers. Staff made aware. MD notified to modify order.

## 2018-06-05 NOTE — Plan of Care (Addendum)
D: Patient in dayroom interacting appropriately on approach. Patient is alert, oriented, pleasant, and cooperative. Denies SI, HI, AVH, and verbally contracts for safety. Patient reports she had a good day and liked meeting some good people on the unit. Patient appears to be in no distress/pain.    A: Medications administered per MD order. Support provided. Patient educated on safety on the unit and medications. Routine safety checks every 15 minutes. Patient stated understanding to tell nurse about any new physical symptoms. Patient understands to tell staff of any needs.     R: No adverse drug reactions noted. Patient verbally contracts for safety. Patient remains safe at this time and will continue to monitor.   Problem: Education: Goal: Emotional status will improve Outcome: Progressing Goal: Mental status will improve Outcome: Progressing   Problem: Safety: Goal: Periods of time without injury will increase Outcome: Progressing   Patient reports a good day. Patient denies SI, HI, AVH, and contracts for safety.

## 2018-06-05 NOTE — Progress Notes (Signed)
Pt attended spiritual care group on grief and loss facilitated by PhD counseling intern, Ethel Rana, chaplain Burnis Kingfisher   Group opened with brief discussion and psycho-social ed around grief and loss in relationships and in relation to self - identifying life patterns, circumstances, changes that cause losses. Established group norm of speaking from own life experience. Group goal of establishing open and affirming space for members to share loss and experience with grief, normalize grief experience and provide psycho social education and grief support.    Brandon Cox was present throughout group.  Lethargic at times - fell asleep during group.  Appeared to be listening to group discussion when awake.  Did not contribute to group discussion.   Burnis Kingfisher, MDiv, [facility]

## 2018-06-05 NOTE — B[facility] Group Notes (Signed)
Adult Psychoeducational Group Note  Date:  06/05/2018 Time:  10:08 PM  Group Topic/Focus:  Wrap-Up Group:   The focus of this group is to help patients review their daily goal of treatment and discuss progress on daily workbooks.  Participation Level:  Active  Participation Quality:  Appropriate and Attentive  Affect:  Appropriate  Cognitive:  Alert and Appropriate  Insight: Appropriate and Good  Engagement in Group:  Engaged  Modes of Intervention:  Discussion and Education  Additional Comments:  Pt attended and participated in wrap up group this evening. Pt rated their day a 5/10, due to them having a good day hanging out with new people. Pt completed their goal, which was to be more social.   Chrisandra Netters 06/05/2018, 10:08 PM

## 2018-06-05 NOTE — Progress Notes (Signed)
Patient ID: Brandon Cox, adult   DOB: 09-Apr-2001, 18 y.o.   MRN: [id number]  Nursing Progress Note [id number]  Data: On initial approach, patient is seen up in the milieu interacting with peers in the dayroom. Patient presents calm, pleasant and cooperative. Patient is male to male transgender and requests to be called "[name]" with she/her pronouns. Patient compliant with scheduled medications. Patient denies pain/physical complaints. Patient completed self-inventory sheet and rates depression, hopelessness, and anxiety 0,0,3 respectively. Patient rates their sleep and appetite as good/fair respectively. Patient states goal for today is to "stay awake" and "stay in the dayroom". Patient currently denies SI/HI/AVH. Patient requests to sign 72 hour request for discharge.  Action: Patient is educated about and provided medication per provider's orders. Patient safety maintained with q15 min safety checks and frequent rounding. Low fall risk precautions in place. Emotional support given. 1:1 interaction and active listening provided. Patient encouraged to attend meals, groups, and work on treatment plan and goals. Labs, vital signs and patient behavior monitored throughout shift. Patient educated about 72 hour request for discharge process; patient verbalizes understanding.  Response: Patient remains safe on the unit at this time and agrees to come to staff with any issues/concerns. Patient is interacting with peers appropriately on the unit. Patient signed 72 hour request for discharge at 0800; form placed on chart. Will continue to support and monitor.

## 2018-06-06 DIAGNOSIS — F332 Major depressive disorder, recurrent severe without psychotic features: Principal | ICD-10-CM

## 2018-06-06 MED ORDER — HYDROXYZINE HCL 50 MG PO TABS: 50.0000 mg | ORAL_TABLET | Freq: Three times a day (TID) | ORAL | 0 refills | Status: DC | PRN

## 2018-06-06 MED ORDER — TRAZODONE HCL 50 MG PO TABS: 50.0000 mg | ORAL_TABLET | Freq: Every evening | ORAL | 0 refills | Status: DC | PRN

## 2018-06-06 MED ORDER — SPIRONOLACTONE 25 MG PO TABS: 12.5000 mg | ORAL_TABLET | Freq: Every day | ORAL | 0 refills | Status: DC

## 2018-06-06 MED ORDER — ACETAMINOPHEN 325 MG PO TABS: 650.0000 mg | ORAL_TABLET | Freq: Four times a day (QID) | ORAL | 0 refills | Status: DC | PRN

## 2018-06-06 MED ORDER — OXCARBAZEPINE 150 MG PO TABS: 150.0000 mg | ORAL_TABLET | Freq: Two times a day (BID) | ORAL | 0 refills | Status: DC

## 2018-06-06 MED ORDER — ESTRADIOL 1 MG PO TABS: 2.0000 mg | ORAL_TABLET | Freq: Every day | ORAL | 0 refills | Status: DC

## 2018-06-06 MED ORDER — NICOTINE POLACRILEX 2 MG MT GUM: 2.0000 mg | CHEWING_GUM | OROMUCOSAL | 0 refills | Status: DC | PRN

## 2018-06-06 MED ORDER — ARIPIPRAZOLE 5 MG PO TABS: 5.0000 mg | ORAL_TABLET | Freq: Every day | ORAL | 0 refills | Status: DC

## 2018-06-06 MED ORDER — GABAPENTIN 300 MG PO CAPS: 300.0000 mg | ORAL_CAPSULE | Freq: Two times a day (BID) | ORAL | 0 refills | Status: DC | PRN

## 2018-06-06 MED ORDER — VENLAFAXINE HCL ER 75 MG PO CP24: 75.0000 mg | ORAL_CAPSULE | Freq: Every day | ORAL | 0 refills | Status: DC

## 2018-06-06 NOTE — Progress Notes (Signed)
Patient ID: Brandon Cox, adult   DOB: [birth year], 18 y.o.   MRN: [id number]  Discharge Note  D) Patient discharged to lobby. Patient states readiness for discharge. Patient denies SI/HI, AVH and is not delusional or psychotic.   A) Written and verbal discharge instructions given to the patient. Patient accepting to information and verbalized understanding. Patient agrees to the discharge plan. Opportunity for questions and concerns presented to patient. Patient denied any further questions or concerns. All belongings returned to patient. Patient signed for return of belongings and discharge paperwork. Patient has completed their Suicide Safety Plan and has been provided Suicide Prevention Education. Patient provided an opportunity to complete and return Patient Satisfaction Survey.   R) Patient safely escorted to the lobby. Patient discharged from [facility] with medication samples, prescriptions, personal belongings, follow-up appointment in place and discharge paperwork.

## 2018-06-06 NOTE — Therapy (Signed)
Occupational Therapy Group Note  Date:  06/06/2018 Time:  11:16 AM  Group Topic/Focus:  Self Esteem Action Plan:   The focus of this group is to help patients create a plan to continue to build self-esteem after discharge.  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 Education given on definition of self esteem, with both causes of low and high self esteem identified. Activity given for pt to identify a positive/aspiring trait for each letter of the alphabet. Pt to work with peers to help complete activity and build positive thinking.   A: Pt presents to group with jovial affect, joking and engaged in group this date. Pt shares that being transgender and having gender dysphoria affects her self esteem. She shares that positive social support increases her self esteem. A-Z activity completed with moderate VC's. Pt then asking other members to share their most unique traits.  P: Education given on self esteem and how to improve this date. Handouts and activities given to help facilitate skills when reintegrating into community  Dalphine Handing, MSOT, OTR/L [employer] OT/ Acute Relief OT PHP Office: [phone]  Dalphine Handing 06/06/2018, 11:16 AM

## 2018-06-06 NOTE — BHH Suicide Risk Assessment (Signed)
B[facility] INPATIENT:  Family/Significant Other Suicide Prevention Education  Suicide Prevention Education:  Education Completed; friend, Fabienne Bruns, [phone] has been identified by the patient as the family member/significant other with whom the patient will be residing, and identified as the person(s) who will aid the patient in the event of a mental health crisis (suicidal ideations/suicide attempt).  With written consent from the patient, the family member/significant other has been provided the following suicide prevention education, prior to the and/or following the discharge of the patient.  The suicide prevention education provided includes the following:  Suicide risk factors  Suicide prevention and interventions  National Suicide Hotline telephone number  [facility] assessment telephone number  [facility] Emergency Assistance 911  [facility] and/or Residential Mobile Crisis Unit telephone number  Request made of family/significant other to:  Remove weapons (e.g., guns, rifles, knives), all items previously/currently identified as safety concern.    Remove drugs/medications (over-the-counter, prescriptions, illicit drugs), all items previously/currently identified as a safety concern.  The family member/significant other verbalizes understanding of the suicide prevention education information provided.  The family member/significant other agrees to remove the items of safety concern listed above.   SPE contact called shortly after patient was discharged. Brandon Cox has no safety concerns for Brandon Cox, she wanted to make sure Brandon Cox got established with medications and outpatient follow up.   Darreld Mclean 06/06/2018, 1:14 PM

## 2018-06-06 NOTE — BHH Suicide Risk Assessment (Signed)
[facility] INPATIENT:  Family/Significant Other Suicide Prevention Education  Suicide Prevention Education:  Contact Attempts: friend, Fabienne Bruns, [phone] has been identified by the patient as the family member/significant other with whom the patient will be residing, and identified as the person(s) who will aid the patient in the event of a mental health crisis.  With written consent from the patient, two attempts were made to provide suicide prevention education, prior to and/or following the patient's discharge.  We were unsuccessful in providing suicide prevention education.  A suicide education pamphlet was given to the patient to share with family/significant other.  Date and time of first attempt: 06/06/2018 at 08:55am. Left voicemail.  Date and time of second attempt: will attempt at a later time  Brandon Cox 06/06/2018, 8:58 AM

## 2018-06-06 NOTE — Progress Notes (Signed)
  [facility] Adult Case Management Discharge Plan :  Will you be returning to the same living situation after discharge:  No. Going to stay with friends for a few days. At discharge, do you have transportation home?: Yes,  car is here. Will leave around 1pm. Do you have the ability to pay for your medications: Yes,  Income from employment  Release of information consent forms completed and in the chart; work letter on chart.  Patient to Follow up at: Follow-up Information    [facility] Follow up on 06/08/2018.   Why:  Hospital follow up appointment is Friday, 2/21 at 8:00a. Please bring your photo ID, proof of insurance, current medications, and discharge paperwork from this hospitalization.  Contact information: [address] [place] [address] [phone]           Next level of care provider has access to [facility] Link:no  Safety Planning and Suicide Prevention discussed: Yes,  with patient. Attempted to reach friend Danley Danker  Have you used any form of tobacco in the last 30 days? (Cigarettes, Smokeless Tobacco, Cigars, and/or Pipes): Yes  Has patient been referred to the Quitline?: Patient refused referral  Patient has been referred for addiction treatment: Yes  Darreld Mclean, LCSWA 06/06/2018, 10:55 AM

## 2018-06-06 NOTE — BHH Suicide Risk Assessment (Signed)
[facility] INPATIENT:  Family/Significant Other Suicide Prevention Education  Suicide Prevention Education:  Contact Attempts: friend, Fabienne Bruns, [phone] has been identified by the patient as the family member/significant other with whom the patient will be residing, and identified as the person(s) who will aid the patient in the event of a mental health crisis.  With written consent from the patient, two attempts were made to provide suicide prevention education, prior to and/or following the patient's discharge.  We were unsuccessful in providing suicide prevention education.  A suicide education pamphlet was given to the patient to share with family/significant other.  Date and time of first attempt: 06/06/2018 at 08:55am. Left voicemail.  Date and time of second attempt: 06/06/2018 at 11:47am  Darreld Mclean 06/06/2018, 11:50 AM

## 2018-06-06 NOTE — Discharge Summary (Addendum)
Physician Discharge Summary Note  Patient:  Brandon Cox is an 18 y.o., adult MRN:  [id number] DOB:  09/10/2000 Patient phone:  [phone] (home)  Patient address:   [address] [place] [id number],  Total Time spent with patient: 15 minutes  Date of Admission:  06/02/2018 Date of Discharge: 06/06/2018  Reason for Admission: suicidal ideation  Principal Problem: Severe recurrent major depression without psychotic features [facility](HCC) Discharge Diagnoses: Principal Problem:   Severe recurrent major depression without psychotic features [facility](HCC)   Past Psychiatric History: Per admission H&P: Patient's last admission to our facility was on 08/14/2017.  She was diagnosed with major depression at that time.  She also had a prior psychiatric hospitalization at [facility].  There was a suicide attempt at age 18.  A remote history of self cutting while in middle school.  Past Medical History:  Past Medical History:  Diagnosis Date  . Depression   . Fracture, cervical vertebra (HCC) 10/22/2015   c6  . History of ITP    History reviewed. No pertinent surgical history. Family History:  Family History  Problem Relation Age of Onset  . Healthy Mother   . Drug abuse Cousin    Family Psychiatric  History: Per admission H&P: Denied Social History:  Social History   Substance and Sexual Activity  Alcohol Use No  . Frequency: Never     Social History   Substance and Sexual Activity  Drug Use Yes  . Types: Marijuana   Comment: used a couple of times in the last couple weeks    Social History   Socioeconomic History  . Marital status: Single    Spouse name: Not on file  . Number of children: 0  . Years of education: Not on file  . Highest education level: High school graduate  Occupational History  . Occupation: [employer]    Comment: part time  Social Needs  . Financial resource strain: Very hard  . Food insecurity:    Worry: Never true    Inability: Never true  .  Transportation needs:    Medical: No    Non-medical: No  Tobacco Use  . Smoking status: Current Every Day Smoker    Packs/day: 1.00    Types: Cigarettes  . Smokeless tobacco: Never Used  . Tobacco comment: 1 pack per week  Substance and Sexual Activity  . Alcohol use: No    Frequency: Never  . Drug use: Yes    Types: Marijuana    Comment: used a couple of times in the last couple weeks  . Sexual activity: Not Currently  Lifestyle  . Physical activity:    Days per week: 0 days    Minutes per session: 0 min  . Stress: Very much  Relationships  . Social connections:    Talks on phone: Once a week    Gets together: More than three times a week    Attends religious service: Never    Active member of club or organization: No    Attends meetings of clubs or organizations: Never    Relationship status: Never married  Other Topics Concern  . Not on file  Social History Narrative  . Not on file    Hospital Course:  Per admission H&P 06/02/2018: Patient is a 18 year old transgender male to male who presented as a walk-in to the behavioral health hospital on 06/02/2018 with suicidal ideation. The patient has a past psychiatric history significant for major depression and was last  admitted to our facility on 08/14/2017. She was in the hospital for 4 days at that time. She was discharged on Abilify 5 mg p.o. daily, estradiol 1 mg p.o. twice daily, trazodone 50 mg p.o. nightly and venlafaxine 75 mg p.o. daily. The patient stated that she was doing well, but unfortunately over the last several months had lost her insurance. After she lost her insurance she was unable to obtain her psychiatric medicines as well as her hormonal medicines for her transgender transition. She stated that things over the last week or 2 it worsened. She was having thoughts of self-harm. Prior to being admitted to the hospital last night she was thinking about cutting herself. The decision was made because of the  worsening depression that she should be admitted to the hospital for evaluation and stabilization. She denied any recent additional stressors. It should be noted that the patient's family is not supportive of her transgender physician.  Ms. Broom was admitted for suicidal ideation. She had stopped medications recently due to loss of insurance. She was restarted on Effexor and Abilify, as well as estradiol for gender transition. She was started on PRN Vistaril and trazodone. She participated in group therapy on the unit. Her parents are unsupportive of transgender transition, but patient reported good support network of friends. She remained on the [facility] unit for 5 days. She stabilized with medication and therapy. She was discharged on the medications listed below. She has shown improvement with improved mood, affect, sleep, appetite, and interaction. She denies any SI/HI/AVH and contracts for safety. She agrees to follow up at [facility] (see below). Patient is provided with prescriptions and medication samples upon discharge. She is leaving in her own vehicle to stay with friends.  Physical Findings: AIMS: Facial and Oral Movements Muscles of Facial Expression: None, normal Lips and Perioral Area: None, normal Jaw: None, normal Tongue: None, normal,Extremity Movements Upper (arms, wrists, hands, fingers): None, normal Lower (legs, knees, ankles, toes): None, normal, Trunk Movements Neck, shoulders, hips: None, normal, Overall Severity Severity of abnormal movements (highest score from questions above): None, normal Incapacitation due to abnormal movements: None, normal Patient's awareness of abnormal movements (rate only patient's report): No Awareness, Dental Status Current problems with teeth and/or dentures?: No Does patient usually wear dentures?: No  CIWA:    COWS:     Musculoskeletal: Strength & Muscle Tone: within normal limits Gait & Station: normal Patient leans: N/A  Psychiatric  Specialty Exam: Physical Exam  Nursing note and vitals reviewed. Constitutional: She is oriented to person, place, and time. She appears well-developed and well-nourished.  Cardiovascular: Normal rate.  Respiratory: Effort normal.  Neurological: She is alert and oriented to person, place, and time.    Review of Systems  Constitutional: Negative.   Psychiatric/Behavioral: Positive for depression (improving) and substance abuse (UDS +THC). Negative for hallucinations, memory loss and suicidal ideas. The patient is not nervous/anxious and does not have insomnia.     Blood pressure (!) 136/117, pulse (!) 101, temperature 97.9 F (36.6 C), temperature source Oral, resp. rate 20, height 6\' 1"  (1.854 m), weight 101.2 kg.Body mass index is 29.42 kg/m.  See MD's discharge SRA     Have you used any form of tobacco in the last 30 days? (Cigarettes, Smokeless Tobacco, Cigars, and/or Pipes): Yes  Has this patient used any form of tobacco in the last 30 days? (Cigarettes, Smokeless Tobacco, Cigars, and/or Pipes) Yes, a prescription for an FDA-approved medication for tobacco cessation was offered at discharge.  Blood Alcohol level:  Lab Results  Component Value Date   ETH <10 02/16/2017    Metabolic Disorder Labs:  Lab Results  Component Value Date   HGBA1C 4.8 06/02/2018   MPG 91 06/02/2018   No results found for: PROLACTIN Lab Results  Component Value Date   CHOL 117 06/02/2018   TRIG 88 06/02/2018   HDL 33 (L) 06/02/2018   CHOLHDL 3.5 06/02/2018   VLDL 18 06/02/2018   LDLCALC 66 06/02/2018    See Psychiatric Specialty Exam and Suicide Risk Assessment completed by Attending Physician prior to discharge.  Discharge destination:  Home  Is patient on multiple antipsychotic therapies at discharge:  No   Has Patient had three or more failed trials of antipsychotic monotherapy by history:  No  Recommended Plan for Multiple Antipsychotic Therapies: NA  Discharge Instructions     Discharge instructions   Complete by:  As directed    Patient is instructed to take all prescribed medications as recommended. Report any side effects or adverse reactions to your outpatient psychiatrist. Patient is instructed to abstain from alcohol and illegal drugs while on prescription medications. In the event of worsening symptoms, patient is instructed to call the crisis hotline, 911, or go to the nearest emergency department for evaluation and treatment.     Allergies as of 06/06/2018   No Known Allergies     Medication List    STOP taking these medications   amitriptyline 25 MG tablet Commonly known as:  ELAVIL     TAKE these medications     Indication  acetaminophen 325 MG tablet Commonly known as:  TYLENOL Take 2 tablets (650 mg total) by mouth every 6 (six) hours as needed for mild pain. (May buy over the counter)  Indication:  Pain   ARIPiprazole 5 MG tablet Commonly known as:  ABILIFY Take 1 tablet (5 mg total) by mouth daily. For mood Start taking on:  June 07, 2018 What changed:    medication strength  how much to take  additional instructions  Indication:  Mood   estradiol 1 MG tablet Commonly known as:  ESTRACE Take 2 tablets (2 mg total) by mouth daily for 30 days. For gender transition What changed:  additional instructions  Indication:  Gender transition   gabapentin 300 MG capsule Commonly known as:  NEURONTIN Take 1 capsule (300 mg total) by mouth 2 (two) times daily as needed (anxiety). What changed:    medication strength  how much to take  reasons to take this  Indication:  Neuropathic Pain, Social Anxiety Disorder   hydrOXYzine 50 MG tablet Commonly known as:  ATARAX/VISTARIL Take 1 tablet (50 mg total) by mouth 3 (three) times daily as needed for anxiety. What changed:    reasons to take this  additional instructions  Indication:  Feeling Anxious   nicotine polacrilex 2 MG gum Commonly known as:  NICORETTE Take 1 each  (2 mg total) by mouth as needed for smoking cessation.  Indication:  Nicotine Addiction   OXcarbazepine 150 MG tablet Commonly known as:  TRILEPTAL Take 1 tablet (150 mg total) by mouth 2 (two) times daily. For mood  Indication:  Mood stabilization.   spironolactone 25 MG tablet Commonly known as:  ALDACTONE Take 0.5 tablets (12.5 mg total) by mouth daily. For gender transition Start taking on:  June 07, 2018  Indication:  Person Born Male, Identifies as Male   traZODone 50 MG tablet Commonly known as:  DESYREL Take 1 tablet (50  mg total) by mouth at bedtime as needed and may repeat dose one time if needed for sleep.  Indication:  Trouble Sleeping   venlafaxine XR 75 MG 24 hr capsule Commonly known as:  EFFEXOR-XR Take 1 capsule (75 mg total) by mouth daily with breakfast. For mood Start taking on:  June 07, 2018 What changed:    medication strength  how much to take  when to take this  additional instructions  Indication:  Mood      Follow-up Information    [facility] Follow up on 06/08/2018.   Why:  Hospital follow up appointment is Friday, 2/21 at 8:00a. Please bring your photo ID, proof of insurance, current medications, and discharge paperwork from this hospitalization.  Contact information: [address] [place] [phone]           Follow-up recommendations: Activity as tolerated. Diet as recommended by primary care physician. Keep all scheduled follow-up appointments as recommended.   Comments:   Patient is instructed to take all prescribed medications as recommended. Report any side effects or adverse reactions to your outpatient psychiatrist. Patient is instructed to abstain from alcohol and illegal drugs while on prescription medications. In the event of worsening symptoms, patient is instructed to call the crisis hotline, 911, or go to the nearest emergency department for evaluation and treatment.  Signed: Aldean BakerJanet E Sykes,  NP 06/06/2018, 2:08 PM   Patient seen, Suicide Assessment Completed.  Disposition Plan Reviewed

## 2018-06-06 NOTE — Plan of Care (Signed)
  Problem: Education: Goal: Knowledge of [facility] Education information/materials will improve Outcome: Progressing   Problem: Activity: Goal: Interest or engagement in activities will improve Outcome: Progressing   Problem: Health Behavior/Discharge Planning: Goal: Compliance with treatment plan for underlying cause of condition will improve Outcome: Progressing   Problem: Safety: Goal: Periods of time without injury will increase Outcome: Progressing   

## 2018-06-06 NOTE — Progress Notes (Signed)
Patient ID: Brandon Cox, adult   DOB: 2000-07-25, 18 y.o.   MRN: [id number]  Nursing Progress Note [id number]  Patient is male to male transgender and requests to be called "Brandon Cox" with she/her pronouns.  On initial approach, patient is seen up in the dayroom interacting with her peers. Patient is observed playing cards and has been attending groups on the unit. Patient presents with animated affect and is pleasant during interactions. Patient compliant with scheduled medications. Patient currently denies SI/HI/AVH. Patient reports she would like to discharge today and is feeling "so much better".  Patient is educated about and provided medication per provider's orders. Patient safety maintained with q15 min safety checks and low fall risk precautions. Emotional support given, 1:1 interaction, and active listening provided. Patient encouraged to attend meals, groups, and work on treatment plan and goals. Labs, vital signs and patient behavior monitored throughout shift.   Patient contracts for safety with staff. Patient remains safe on the unit at this time and agrees to come to staff with any issues/concerns. Patient is interacting with peers appropriately on the unit. Will continue to support and monitor.   Patient declined to complete self-inventory sheet.  Patient's self-inventory sheet Rated Sleep  Good  Rated Appetite  Fair  Rated Anxiety (0-10)  0  Rated Hopelessness (0-10)  0  Rated Depression (0-10)  0  Daily Goal  "make sure I am ready to leave"  Any Additional Comments:  N/A

## 2018-06-06 NOTE — [facility]HH Suicide Risk Assessment (Addendum)
[facility] Discharge Suicide Risk Assessment   Principal Problem: Severe recurrent major depression without psychotic features [facility]) Discharge Diagnoses: Principal Problem:   Severe recurrent major depression without psychotic features (HCC)   Total Time spent with patient: 30 minutes  Musculoskeletal: Strength & Muscle Tone: within normal limits Gait & Station: normal Patient leans: N/A  Psychiatric Specialty Exam: ROS denies headache, no chest pain, no shortness of breath, no vomiting, no fever, no chills  Blood pressure 132/75, pulse (!) 132, temperature 97.9 F (36.6 C), temperature source Oral, resp. rate 20, height 6\' 1"  (1.854 m), weight 101.2 kg.Body mass index is 29.42 kg/m.  Repeat Pulse 101.   General Appearance: Well Groomed  Eye Contact::  Good  Speech:  Normal Rate409  Volume:  Normal  Mood:  Reports feeling "a lot better" currently denies depression and presents euthymic  Affect:  Appropriate and Full Range  Thought Process:  Linear and Descriptions of Associations: Intact  Orientation:  Full (Time, Place, and Person)  Thought Content:  No hallucinations, no delusions  Suicidal Thoughts:  No no suicidal or self-injurious ideations, no homicidal or violent ideations  Homicidal Thoughts:  No  Memory:  Recent and remote grossly intact  Judgement:  Other:  Improving  Insight:  Improving  Psychomotor Activity:  Normal-no psychomotor restlessness or agitation  Concentration:  Good  Recall:  Good  Fund of Knowledge:Good  Language: Good  Akathisia:  Negative  Handed:  Right  AIMS (if indicated):     Assets:  Communication Skills Desire for Improvement Resilience  Sleep:  Number of Hours: 6.75  Cognition: WNL  ADL's:  Intact   Mental Status Per Nursing Assessment::   On Admission:  Suicidal ideation indicated by patient  Demographic Factors:  18 year old transgender male, single, no children, lives with parents  Loss Factors: Reports some family members have  difficulty accepting transgender status Insurance difficulties, leading to suboptimal medication compliance.  Historical Factors: History of depression, history of prior psychiatric admissions, history of suicide attempt as a teenager and of self cutting  Risk Reduction Factors:   Sense of responsibility to family, Living with another person, especially a relative and Positive coping skills or problem solving skills  Continued Clinical Symptoms:  At present patient is alert, attentive, well-groomed, pleasant, calm, reports much improved mood and currently minimizes depression, presents euthymic, with a full range of affect, no thought disorder, not suicidal or homicidal, no hallucinations, no delusions, future oriented At this time denies medication side effects, feels medications are helping and well-tolerated. Side effects reviewed. No disruptive or agitated behaviors on unit, visible in day room, pleasant on approach.  Cognitive Features That Contribute To Risk:  No gross cognitive deficits noted upon discharge. Is alert , attentive, and oriented x 3    Suicide Risk:  Mild:  Suicidal ideation of limited frequency, intensity, duration, and specificity.  There are no identifiable plans, no associated intent, mild dysphoria and related symptoms, good self-control (both objective and subjective assessment), few other risk factors, and identifiable protective factors, including available and accessible social support.    Plan Of Care/Follow-up recommendations:  Activity:  As tolerated Diet:  Regular Tests:  NA Other:  See below  Patient is requesting discharge and there are no current grounds for involuntary commitment, leaving unit in good spirits, plans to stay with a friend for a few days as his parents currently out of town.  Plans to continue outpatient psychiatric treatment.   Craige Cotta, MD 06/06/2018, 9:28 AM

## 2018-06-26 ENCOUNTER — Other Ambulatory Visit: Payer: Self-pay

## 2018-06-26 ENCOUNTER — Ambulatory Visit
Admission: EM | Admit: 2018-06-26 | Discharge: 2018-06-26 | Disposition: A | Discharge status: Home / Self Care | Payer: BLUE CROSS/BLUE SHIELD | Attending: Family Medicine | Admitting: Family Medicine

## 2018-06-26 DIAGNOSIS — K529 Noninfective gastroenteritis and colitis, unspecified: Secondary | ICD-10-CM

## 2018-06-26 MED ORDER — ONDANSETRON 4 MG PO TBDP: 4.0000 mg | ORAL_TABLET | Freq: Three times a day (TID) | ORAL | 0 refills | Status: DC | PRN

## 2018-06-26 NOTE — Discharge Instructions (Signed)
Lots of fluids.  Medication as needed for nausea/vomiting.  Slow advance to normal foods.  Take care  Dr. Adriana Simas
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
## 2018-06-26 NOTE — ED Triage Notes (Signed)
Patient complains of nausea, vomiting, diarrhea x 1 week.

## 2018-07-05 ENCOUNTER — Other Ambulatory Visit: Payer: Self-pay

## 2018-07-05 ENCOUNTER — Encounter: Payer: Self-pay | Admitting: Emergency Medicine

## 2018-07-05 ENCOUNTER — Observation Stay
Admission: EM | Admit: 2018-07-05 | Discharge: 2018-07-06 | Payer: BLUE CROSS/BLUE SHIELD | Attending: Internal Medicine | Admitting: Internal Medicine

## 2018-07-05 ENCOUNTER — Emergency Department: Payer: BLUE CROSS/BLUE SHIELD

## 2018-07-05 DIAGNOSIS — F17210 Nicotine dependence, cigarettes, uncomplicated: Secondary | ICD-10-CM | POA: Diagnosis not present

## 2018-07-05 DIAGNOSIS — F419 Anxiety disorder, unspecified: Secondary | ICD-10-CM | POA: Diagnosis not present

## 2018-07-05 DIAGNOSIS — G4700 Insomnia, unspecified: Secondary | ICD-10-CM | POA: Diagnosis not present

## 2018-07-05 DIAGNOSIS — Z79899 Other long term (current) drug therapy: Secondary | ICD-10-CM | POA: Insufficient documentation

## 2018-07-05 DIAGNOSIS — X58XXXA Exposure to other specified factors, initial encounter: Secondary | ICD-10-CM | POA: Insufficient documentation

## 2018-07-05 DIAGNOSIS — T50901A Poisoning by unspecified drugs, medicaments and biological substances, accidental (unintentional), initial encounter: Secondary | ICD-10-CM | POA: Diagnosis present

## 2018-07-05 DIAGNOSIS — T50912A Poisoning by multiple unspecified drugs, medicaments and biological substances, intentional self-harm, initial encounter: Secondary | ICD-10-CM | POA: Diagnosis not present

## 2018-07-05 DIAGNOSIS — G8929 Other chronic pain: Secondary | ICD-10-CM | POA: Insufficient documentation

## 2018-07-05 DIAGNOSIS — F4310 Post-traumatic stress disorder, unspecified: Secondary | ICD-10-CM | POA: Insufficient documentation

## 2018-07-05 DIAGNOSIS — T1491XA Suicide attempt, initial encounter: Secondary | ICD-10-CM

## 2018-07-05 DIAGNOSIS — T50902A Poisoning by unspecified drugs, medicaments and biological substances, intentional self-harm, initial encounter: Secondary | ICD-10-CM

## 2018-07-05 DIAGNOSIS — F332 Major depressive disorder, recurrent severe without psychotic features: Secondary | ICD-10-CM | POA: Insufficient documentation

## 2018-07-05 DIAGNOSIS — R45851 Suicidal ideations: Secondary | ICD-10-CM

## 2018-07-05 DIAGNOSIS — F39 Unspecified mood [affective] disorder: Secondary | ICD-10-CM

## 2018-07-05 LAB — ACETAMINOPHEN LEVEL: Acetaminophen (Tylenol), Serum: <10 ug/mL — ABNORMAL LOW (ref 10–30)

## 2018-07-05 LAB — COMPREHENSIVE METABOLIC PANEL
ALK PHOS: 92 U/L (ref 38–126)
ALT: 34 U/L (ref 0–44)
AST: 27 U/L (ref 15–41)
Albumin: 4.4 g/dL (ref 3.5–5.0)
Anion gap: 9 (ref 5–15)
BILIRUBIN TOTAL: 0.5 mg/dL (ref 0.3–1.2)
BUN: 18 mg/dL (ref 6–20)
CO2: 28 mmol/L (ref 22–32)
Calcium: 9.2 mg/dL (ref 8.9–10.3)
Chloride: 102 mmol/L (ref 98–111)
Creatinine, Ser: 0.85 mg/dL (ref 0.61–1.24)
GFR calc Af Amer: >60 mL/min (ref >60)
GFR calc non Af Amer: >60 mL/min (ref >60)
Glucose, Bld: 122 mg/dL — ABNORMAL HIGH (ref 70–99)
Potassium: 3.7 mmol/L (ref 3.5–5.1)
SODIUM: 139 mmol/L (ref 135–145)
TOTAL PROTEIN: 8 g/dL (ref 6.5–8.1)

## 2018-07-05 LAB — CBC WITH DIFFERENTIAL/PLATELET
Abs Immature Granulocytes: 0.02 10*3/uL (ref 0.00–0.07)
Basophils Absolute: 0 10*3/uL (ref 0.0–0.1)
Basophils Relative: 0 %
Eosinophils Absolute: 0.2 10*3/uL (ref 0.0–0.5)
Eosinophils Relative: 2 %
HCT: 48.9 % (ref 39.0–52.0)
Hemoglobin: 16.8 g/dL (ref 13.0–17.0)
IMMATURE GRANULOCYTES: 0 %
LYMPHS PCT: 28 %
Lymphs Abs: 2 10*3/uL (ref 0.7–4.0)
MCH: 30.1 pg (ref 26.0–34.0)
MCHC: 34.4 g/dL (ref 30.0–36.0)
MCV: 87.6 fL (ref 80.0–100.0)
Monocytes Absolute: 0.6 10*3/uL (ref 0.1–1.0)
Monocytes Relative: 8 %
NEUTROS PCT: 62 %
Neutro Abs: 4.3 10*3/uL (ref 1.7–7.7)
Platelets: 273 10*3/uL (ref 150–400)
RBC: 5.58 MIL/uL (ref 4.22–5.81)
RDW: 12.3 % (ref 11.5–15.5)
WBC: 7.1 10*3/uL (ref 4.0–10.5)
nRBC: 0 % (ref 0.0–0.2)

## 2018-07-05 LAB — URINE DRUG SCREEN, QUALITATIVE (ARMC ONLY)
Amphetamines, Ur Screen: NOT DETECTED
BENZODIAZEPINE, UR SCRN: NOT DETECTED
Barbiturates, Ur Screen: NOT DETECTED
Cannabinoid 50 Ng, Ur ~~LOC~~: NOT DETECTED
Cocaine Metabolite,Ur ~~LOC~~: NOT DETECTED
MDMA (Ecstasy)Ur Screen: NOT DETECTED
Methadone Scn, Ur: NOT DETECTED
OPIATE, UR SCREEN: NOT DETECTED
PHENCYCLIDINE (PCP) UR S: NOT DETECTED
Tricyclic, Ur Screen: NOT DETECTED

## 2018-07-05 LAB — SALICYLATE LEVEL: Salicylate Lvl: <7 mg/dL (ref 2.8–30.0)

## 2018-07-05 LAB — MAGNESIUM: Magnesium: 2 mg/dL (ref 1.7–2.4)

## 2018-07-05 LAB — ETHANOL: Alcohol, Ethyl (B): <10 mg/dL (ref <10)

## 2018-07-05 MED ORDER — SPIRONOLACTONE 25 MG PO TABS
12.5000 mg | ORAL_TABLET | Freq: Every day | ORAL | Status: DC
  Administered 2018-07-06: 12.5 mg via ORAL
  Filled 2018-07-05: qty 0.5
  Filled 2018-07-05: qty 1

## 2018-07-05 MED ORDER — ESTRADIOL 1 MG PO TABS
2.0000 mg | ORAL_TABLET | Freq: Every day | ORAL | Status: DC
  Administered 2018-07-06: 2 mg via ORAL
  Filled 2018-07-05: qty 2

## 2018-07-05 MED ORDER — NICOTINE POLACRILEX 2 MG MT GUM
4.0000 mg | CHEWING_GUM | OROMUCOSAL | Status: DC | PRN
  Administered 2018-07-05 – 2018-07-06 (×2): 4 mg via ORAL
  Filled 2018-07-05 (×4): qty 2

## 2018-07-05 MED ORDER — ACETAMINOPHEN 650 MG RE SUPP: 650.0000 mg | Freq: Four times a day (QID) | RECTAL | Status: DC | PRN

## 2018-07-05 MED ORDER — SODIUM CHLORIDE 0.9 % IV SOLN
INTRAVENOUS | Status: DC
  Administered 2018-07-05: 15:00:00 via INTRAVENOUS

## 2018-07-05 MED ORDER — ENOXAPARIN SODIUM 40 MG/0.4ML ~~LOC~~ SOLN
40.0000 mg | SUBCUTANEOUS | Status: DC
  Filled 2018-07-05: qty 0.4

## 2018-07-05 MED ORDER — ACETAMINOPHEN 325 MG PO TABS
650.0000 mg | ORAL_TABLET | Freq: Four times a day (QID) | ORAL | Status: DC | PRN
  Administered 2018-07-05: 650 mg via ORAL
  Filled 2018-07-05: qty 2

## 2018-07-05 MED ORDER — NICOTINE 14 MG/24HR TD PT24
14.0000 mg | MEDICATED_PATCH | Freq: Every day | TRANSDERMAL | Status: DC
  Administered 2018-07-05: 14 mg via TRANSDERMAL
  Filled 2018-07-05: qty 1

## 2018-07-05 NOTE — ED Notes (Signed)
MD malinda spoke with poison control.

## 2018-07-05 NOTE — H&P (Addendum)
[facility] [place] [facility]   PATIENT NAME: Brandon CrouchRaymond Cox    MR#:  [id number]  DATE OF BIRTH:  09/30/2000  DATE OF ADMISSION:  07/05/2018  PRIMARY CARE PHYSICIAN: Patient, No Pcp Per   REQUESTING/REFERRING PHYSICIAN: Dr Dorothea GlassmanPaul Malinda  CHIEF COMPLAINT:   Chief Complaint  Patient presents with  . Drug Overdose    HISTORY OF PRESENT ILLNESS:  Brandon Cox  is a 18 y.o. adult with a known history of anxiety depression insomnia and possible bipolar disorder and PTSD.  He brought himself to the hospital after taking an overdose of pills at 5 AM.  He states he took an unknown amount of Effexor Abilify and Vistaril and a few more types of medications.  He vomited afterwards and he saw some pills.  He brought himself to the emergency room.  Physically he feels okay.  ER physician spoke with poison control and recommended watching on telemetry and serial EKGs since he overdosed on Effexor.  PAST MEDICAL HISTORY:   Past Medical History:  Diagnosis Date  . Depression   . Fracture, cervical vertebra (HCC) 10/22/2015   c6  . History of ITP     PAST SURGICAL HISTORY:  History reviewed. No pertinent surgical history. No surgeries in the past SOCIAL HISTORY:   Social History   Tobacco Use  . Smoking status: Current Every Day Smoker    Packs/day: 1.00    Types: Cigarettes  . Smokeless tobacco: Current User    Types: Chew  . Tobacco comment: 1 pack per week  Substance Use Topics  . Alcohol use: No    Frequency: Never    FAMILY HISTORY:   Family History  Problem Relation Age of Onset  . Healthy Mother   . Drug abuse Cousin     DRUG ALLERGIES:  No Known Allergies  REVIEW OF SYSTEMS:  CONSTITUTIONAL: No fever, fatigue or weakness.  EYES: No blurred or double vision.  EARS, NOSE, AND THROAT: No tinnitus or ear pain. No sore throat RESPIRATORY: No cough, shortness of breath, wheezing or hemoptysis.  CARDIOVASCULAR: No chest pain,  orthopnea, edema.  GASTROINTESTINAL: No nausea, vomiting, diarrhea or abdominal pain. No blood in bowel movements GENITOURINARY: No dysuria, hematuria.  ENDOCRINE: No polyuria, nocturia,  HEMATOLOGY: No anemia, easy bruising or bleeding SKIN: No rash or lesion. MUSCULOSKELETAL: Some neck pain NEUROLOGIC: No tingling, numbness, weakness.  PSYCHIATRY: History of anxiety and depression  MEDICATIONS AT HOME:   Prior to Admission medications   Medication Sig Start Date End Date Taking? Authorizing Provider  acetaminophen (TYLENOL) 325 MG tablet Take 2 tablets (650 mg total) by mouth every 6 (six) hours as needed for mild pain. (May buy over the counter) 06/06/18  Yes Aldean BakerSykes, Janet E, NP  ARIPiprazole (ABILIFY) 5 MG tablet Take 1 tablet (5 mg total) by mouth daily. For mood 06/07/18  Yes Aldean BakerSykes, Janet E, NP  estradiol (ESTRACE) 1 MG tablet Take 2 tablets (2 mg total) by mouth daily for 30 days. For gender transition 06/06/18 07/06/18 Yes Aldean BakerSykes, Janet E, NP  gabapentin (NEURONTIN) 300 MG capsule Take 1 capsule (300 mg total) by mouth 2 (two) times daily as needed (anxiety). 06/06/18  Yes Aldean BakerSykes, Janet E, NP  hydrOXYzine (ATARAX/VISTARIL) 50 MG tablet Take 1 tablet (50 mg total) by mouth 3 (three) times daily as needed for anxiety. 06/06/18  Yes Aldean BakerSykes, Janet E, NP  nicotine polacrilex (NICORETTE) 2 MG gum Take 1 each (2 mg total) by mouth as needed for smoking  cessation. 06/06/18  Yes Aldean Baker, NP  OXcarbazepine (TRILEPTAL) 150 MG tablet Take 1 tablet (150 mg total) by mouth 2 (two) times daily. For mood 06/06/18  Yes Aldean Baker, NP  spironolactone (ALDACTONE) 25 MG tablet Take 0.5 tablets (12.5 mg total) by mouth daily. For gender transition 06/07/18  Yes Aldean Baker, NP  traZODone (DESYREL) 50 MG tablet Take 1 tablet (50 mg total) by mouth at bedtime as needed and may repeat dose one time if needed for sleep. 06/06/18  Yes Aldean Baker, NP  venlafaxine XR (EFFEXOR-XR) 75 MG 24 hr capsule Take 1  capsule (75 mg total) by mouth daily with breakfast. For mood 06/07/18  Yes Aldean Baker, NP      VITAL SIGNS:  Blood pressure 124/78, pulse 76, temperature 98.4 F (36.9 C), temperature source Oral, resp. rate 20, height 6\' 1"  (1.854 m), weight 102.1 kg, SpO2 99 %.  PHYSICAL EXAMINATION:  GENERAL:  18 y.o.-year-old patient lying in the bed with no acute distress.  EYES: Left pupil slightly more dilated than the right pupil but both reactive to light and extra ocular muscles intact.. No scleral icterus.  HEENT: Head atraumatic, normocephalic. Oropharynx and nasopharynx clear.  NECK:  Supple, no jugular venous distention. No thyroid enlargement, no tenderness.  LUNGS: Normal breath sounds bilaterally, no wheezing, rales,rhonchi or crepitation. No use of accessory muscles of respiration.  CARDIOVASCULAR: S1, S2 normal. No murmurs, rubs, or gallops.  ABDOMEN: Soft, nontender, nondistended. Bowel sounds present. No organomegaly or mass.  EXTREMITIES: No pedal edema, cyanosis, or clubbing.  NEUROLOGIC: Cranial nerves II through XII are intact. Muscle strength 5/5 in all extremities. Sensation intact. Gait not checked.  PSYCHIATRIC: The patient is alert and oriented x 3.  SKIN: No rash, lesion, or ulcer.   LABORATORY PANEL:   CBC Recent Labs  Lab 07/05/18 1019  WBC 7.1  HGB 16.8  HCT 48.9  PLT 273   ------------------------------------------------------------------------------------------------------------------  Chemistries  Recent Labs  Lab 07/05/18 1019 07/05/18 1434  NA 139  --   K 3.7  --   CL 102  --   CO2 28  --   GLUCOSE 122*  --   BUN 18  --   CREATININE 0.85  --   CALCIUM 9.2  --   MG  --  2.0  AST 27  --   ALT 34  --   ALKPHOS 92  --   BILITOT 0.5  --    ------------------------------------------------------------------------------------------------------------------   RADIOLOGY:  Dg Cervical Spine Complete  Result Date: 07/05/2018 CLINICAL DATA:   Chronic pain. EXAM: CERVICAL SPINE - COMPLETE 4+ VIEW COMPARISON:  No prior. FINDINGS: Mild straightening cervical spine. No acute bony abnormality identified. Neuroforamen are patent. Pulmonary apices are clear. IMPRESSION: Mild straightening cervical spine. No acute bony abnormality identified. Electronically Signed   By: Maisie Fus  Register   On: 07/05/2018 11:26    EKG:   Normal sinus rhythm 74 bpm  IMPRESSION AND PLAN:   1.  Drug overdose.  Poison control recommended monitoring for 24 hours on telemetry since he overdosed on Effexor.  Monitor EKG and telemetry monitoring. 2.  Suicide attempt but he brought himself to the hospital.  We will get psychiatric consultation.  ER physician did commitment papers. 3.  Neck pain.  Cervical spine x-ray negative 4.  Gender dysphoria.  Male to male on hormones estradiol and spironolactone. 5.  Anxiety, depression, PTSD, insomnia.  Since the overdose I will hold all of these  medications at this time.  Await for psychiatry to see the patient.  All the records are reviewed and case discussed with ED provider. Management plans discussed with the patient, family and they are in agreement.  CODE STATUS: Full code  TOTAL TIME TAKING CARE OF THIS PATIENT: 50 minutes.    Alford Highland M.D on 07/05/2018 at 4:01 PM  Between 7am to 6pm - Pager - [phone]  After 6pm call admission pager [phone]  [facility] Office  [phone]  CC: Primary care physician; Patient, No Pcp Per

## 2018-07-05 NOTE — ED Triage Notes (Signed)
Pt here with c/o "taking a bunch of pills" including Effexor, visteril, and ambilify 30-35 pills all together around 0500 this am. States he vomited shortly (about 10 min after) he took them, he didn't induce vomiting. States he has been feeling depressed for a while now, came in because he knows he needs help. Currently having suicidal thoughts of killing himself, trying to develop a plan, denies HI.

## 2018-07-05 NOTE — ED Notes (Signed)
Pshychiatrist at bedside.

## 2018-07-05 NOTE — Consult Note (Addendum)
[facility] Face-to-Face Psychiatry Consult   Reason for Consult:  Suicide attempt/overdose on Abilify, vistaril, and Effexor XR Referring Physician:  Alford Highland, MD Patient Identification: Brandon Cox MRN:  [id number] Principal Diagnosis: <principal problem not specified> Diagnosis:  Active Problems:   Overdose   Total Time spent with patient, reviewing patient's chart, discussing plan of care: 1 hour  Subjective/HPI:  Preferred name is "Brandon Cox"..."I've been feeling depressed for a while."   Brandon Cox "Brandon Cox" is a 18 y.o. adult transgender (male to male) patient with history of major depressive disorder and anxiety who was admitted after suicide attempt via overdose on abilify, vistaril and Effexor XR.  Pt believes she took about 30-35 tabs total around 5am this morning (07/05/2018).  She states she's been battling depression for many years.  She lives with her parents in [place], [place] but she states they are unsupportive of her.  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  Pt also states she goes through periods of elevated mood, increase in goal-directed activity, desire for increase in shared sexual experiences (she states that last week she tried to convince her sexual partner to bring others into their experience).  Patient is on hormonal therapy and transitioning from male to male and prefers male partners.   Pt endorses SI, but denies HI, AH, VH, paranoia.   Pt was at [facility] from Feb 15-19, 2020.  She was discharged on Effexor XR 75 mg qd, vistaril 50 mg TID PRN anxiety (minimal benefit), Abilify 5 mg qd, trazodone 50 mg qhs prn sleep, gabapentin 300 mg BID PRN anxiety (worked well for anxiety), and oxcarbazepine 150 mg BID for mood.   Past Psychiatric History: Diagnosed with  Major Depressive Disorder without psychotic features; Anxiety, possible bipolar disorder Prior Psych Hospitalizations: [facility] x 1; [facility] x 3-4 times, last at [facility] Feb 15-19,2020 for suicidal ideation.   Prior suicide attempts: 12th grade-overdose on tylenol and ibuprofen (did not get hospitalized at that time); in 2018-was going to hang self or shoot self (pt states he had a gun), but was stopped by police per pt report. Also per chart review, pt has attempted to kill self several other times, including cutting self with knife, hanging self (including attempting to hang self from balcony about 5 months ago).    Self-injurious behaviors: remote history of cutting in middle school Psychiatric Providers: [facility]"; no current psychiatrist; has seen a therapist in the past, but no one currently; was suppose to follow up at [facility] when discharged from [facility] in Feb 2020 Past Psychotropic medications: Effexor XR 75 mg qd (but worked better at 150 mg daily), vistaril (minimal benefit), Abilify 5 mg qd, trazodone 50 mg qhs prn sleep, Seroquel (too sedating), gabapentin 300 mg BID PRN anxiety (worked well for anxiety), elavil 25 mg (discontinued at [facility]), oxcarbazepine 150 mg BID  Past trauma: 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    Substance use history:  Pt denies history of illicit drug use, but UDS has been positive for THC on prior admissions.  Pt denies alcohol use.  Risk to Self:  yes Risk to Others:  pt denies HI Prior Inpatient Therapy:  yes Prior Outpatient Therapy:  yes  Past Medical History:  Past Medical History:  Diagnosis Date  . Depression   . Fracture, cervical vertebra (HCC) 10/22/2015   c6  . History of ITP    History reviewed. No pertinent surgical  history. Family History:  Family History  Problem Relation Age of Onset  . Healthy Mother   . Drug abuse Cousin    Family Psychiatric  History: Cousin with substance abuse Social History:  Social History   Substance and Sexual Activity  Alcohol Use No  . Frequency: Never     Social History   Substance and Sexual Activity  Drug Use Yes  . Types: Marijuana   Comment: used a couple of times in the last couple weeks    Social History   Socioeconomic History  . Marital status: Single    Spouse name: Not on file  . Number of children: 0  . Years of education: Not on file  . Highest education level: High school graduate  Occupational History  . Occupation: [employer]    Comment: part time  Social Needs  . Financial resource strain: Very hard  . Food insecurity:    Worry: Never true    Inability: Never true  . Transportation needs:    Medical: No    Non-medical: No  Tobacco Use  . Smoking status: Current Every Day Smoker    Packs/day: 1.00    Types: Cigarettes  . Smokeless tobacco: Current User    Types: Chew  . Tobacco comment: 1 pack per week  Substance and Sexual Activity  . Alcohol use: No    Frequency: Never  . Drug use: Yes    Types: Marijuana    Comment: used a couple of times in the last couple weeks  . Sexual activity: Not Currently  Lifestyle  . Physical activity:    Days per week: 0 days    Minutes per session: 0 min  . Stress: Very much  Relationships  . Social connections:    Talks on phone: Once a week    Gets together: More than three times a week    Attends religious service: Never    Active member of club or organization: No    Attends meetings of clubs or organizations: Never    Relationship status: Never married  Other Topics Concern  . Not on file  Social History Narrative  . Not on file   Additional Social History: Lives in [place], [place] with parents; has 2 older sisters.  Single, transgender (male to male); no children.   Currently enrolled in online school "[employer]"-getting associates degree in Health and Human Services Religious Beliefs: Pagan Employment: currently unemployed as of 2 days b/c fired from [employer] job at [employer] b/c "couldn't get out of bed due to depression" Hobbies: playing with her dogs    Allergies:  No Known Allergies  Labs:  Results for orders placed or performed during the hospital encounter of 07/05/18 (from the past 48 hour(s))  Comprehensive metabolic panel     Status: Abnormal   Collection Time: 07/05/18 10:19 AM  Result Value Ref Range   Sodium 139 135 - 145 mmol/L   Potassium 3.7 3.5 - 5.1 mmol/L   Chloride 102 98 - 111 mmol/L   CO2 28 22 - 32 mmol/L   Glucose, Bld 122 (H) 70 - 99 mg/dL  BUN 18 6 - 20 mg/dL   Creatinine, Ser 1.61 0.61 - 1.24 mg/dL   Calcium 9.2 8.9 - 09.6 mg/dL   Total Protein 8.0 6.5 - 8.1 g/dL   Albumin 4.4 3.5 - 5.0 g/dL   AST 27 15 - 41 U/L   ALT 34 0 - 44 U/L   Alkaline Phosphatase 92 38 - 126 U/L   Total Bilirubin 0.5 0.3 - 1.2 mg/dL   GFR calc non Af Amer >60 >60 mL/min   GFR calc Af Amer >60 >60 mL/min   Anion gap 9 5 - 15    Comment: Performed at [facility], [address], [place], [place] [address]  Acetaminophen level     Status: Abnormal   Collection Time: 07/05/18 10:19 AM  Result Value Ref Range   Acetaminophen (Tylenol), Serum <10 (L) 10 - 30 ug/mL    Comment: (NOTE) Therapeutic concentrations vary significantly. A range of 10-30 ug/mL  may be an effective concentration for many patients. However, some  are best treated at concentrations outside of this range. Acetaminophen concentrations >150 ug/mL at 4 hours after ingestion  and >50 ug/mL at 12 hours after ingestion are often associated with  toxic reactions. Performed at [facility], [address] [place], [place] [address]   Ethanol     Status: None   Collection Time: 07/05/18 10:19 AM  Result Value Ref Range   Alcohol,  Ethyl (B) <10 <10 mg/dL    Comment: (NOTE) Lowest detectable limit for serum alcohol is 10 mg/dL. For medical purposes only. Performed at [facility], [address] [place], [place] [address]   Salicylate level     Status: None   Collection Time: 07/05/18 10:19 AM  Result Value Ref Range   Salicylate Lvl <7.0 2.8 - 30.0 mg/dL    Comment: Performed at [facility], [address] [place], [place] [address]  CBC with Differential     Status: None   Collection Time: 07/05/18 10:19 AM  Result Value Ref Range   WBC 7.1 4.0 - 10.5 K/uL   RBC 5.58 4.22 - 5.81 MIL/uL   Hemoglobin 16.8 13.0 - 17.0 g/dL   HCT 30.8 65.7 - 84.6 %   MCV 87.6 80.0 - 100.0 fL   MCH 30.1 26.0 - 34.0 pg   MCHC 34.4 30.0 - 36.0 g/dL   RDW 96.2 95.2 - 84.1 %   Platelets 273 150 - 400 K/uL   nRBC 0.0 0.0 - 0.2 %   Neutrophils Relative % 62 %   Neutro Abs 4.3 1.7 - 7.7 K/uL   Lymphocytes Relative 28 %   Lymphs Abs 2.0 0.7 - 4.0 K/uL   Monocytes Relative 8 %   Monocytes Absolute 0.6 0.1 - 1.0 K/uL   Eosinophils Relative 2 %   Eosinophils Absolute 0.2 0.0 - 0.5 K/uL   Basophils Relative 0 %   Basophils Absolute 0.0 0.0 - 0.1 K/uL   Immature Granulocytes 0 %   Abs Immature Granulocytes 0.02 0.00 - 0.07 K/uL    Comment: Performed at [facility], [address] [place], [place] [address]    Current Facility-Administered Medications  Medication Dose Route Frequency Provider Last Rate Last Dose  . 0.9 %  sodium chloride infusion   Intravenous Continuous Wieting, Richard, MD      . acetaminophen (TYLENOL) tablet 650 mg  650 mg Oral Q6H PRN Alford Highland, MD       Or  . acetaminophen (TYLENOL)  suppository 650 mg  650 mg Rectal Q6H PRN Alford Highland, MD      . enoxaparin (LOVENOX) injection 40 mg  40 mg Subcutaneous Q24H Wieting, Richard, MD      . Melene Muller ON 07/06/2018] estradiol (ESTRACE) tablet 2 mg  2 mg Oral Daily Wieting, Richard, MD      . nicotine (NICODERM CQ - dosed  in mg/24 hours) patch 14 mg  14 mg Transdermal Daily Wieting, Richard, MD      . Melene Muller ON 07/06/2018] spironolactone (ALDACTONE) tablet 12.5 mg  12.5 mg Oral Daily Alford Highland, MD        Musculoskeletal: Strength & Muscle Tone: within normal limits Gait & Station: did not see ambulate Patient leans: N/A  Psychiatric Specialty Exam: Physical Exam  Nursing note and vitals reviewed. Constitutional: She is oriented to person, place, and time. She appears well-developed and well-nourished. No distress.  HENT:  Head: Normocephalic and atraumatic.  Eyes: Pupils are equal, round, and reactive to light.  Neck: Normal range of motion.  Cardiovascular: Normal rate and regular rhythm.  Respiratory: Effort normal.  Distant breath sounds, but in no acute distress  GI: Bowel sounds are normal.  Musculoskeletal: Normal range of motion.  Neurological: She is alert and oriented to person, place, and time.  Skin: Skin is warm and dry. She is not diaphoretic.  Psychiatric: Her behavior is normal. Her mood appears not anxious. Cognition and memory are normal. She exhibits a depressed mood. She expresses suicidal ideation. She expresses no homicidal ideation.    Review of Systems  Constitutional: Negative.   HENT: Negative.   Eyes: Negative.   Respiratory: Negative.   Cardiovascular: Negative.   Gastrointestinal:       Vomited after overdose   Genitourinary: Negative.   Musculoskeletal: Negative.   Neurological: Negative.   Psychiatric/Behavioral: Positive for depression and suicidal ideas. Negative for hallucinations, memory loss and substance abuse. The patient is nervous/anxious. The patient does not have insomnia.     Blood pressure 135/75, pulse 74, temperature 99.1 F (37.3 C), temperature source Oral, resp. rate 20, height 6\' 1"  (1.854 m), weight 102.1 kg, SpO2 99 %.Body mass index is 29.69 kg/m.  General Appearance: transgender (male to male); hair dyed green; well nourished  Eye  Contact:  Good  Speech:  speech disorder (speech apraxia)  Volume:  Normal  Mood:  Depressed  Affect:  Non-Congruent and pt appeared brighter; smiled appropriately  Thought Process:  Coherent and Linear  Orientation:  Full (Time, Place, and Person)  Thought Content:  Logical  Suicidal Thoughts:  Yes, but thankful to be alive; not currently thinking of ways to hurt self  Homicidal Thoughts:  pt denies HI  Memory:  grossly intact  Judgement:  Intact  Insight:  Present  Psychomotor Activity:  Normal  Concentration:  Concentration: Fair and Attention Span: Fair  Recall:  [employer] of Knowledge:  Fair  Language:  fair; speech apraxia  Akathisia:  No  AIMS (if indicated):     Assets:  Desire for Improvement Physical Health Resilience  ADL's:  Intact  Cognition:  WNL  Sleep:   fair     Treatment Plan Summary: Daily contact with patient to assess and evaluate symptoms and progress in treatment, Medication management and Plan   18 yo transgender patient (male to male) with history of depression and anxiety admitted after intentional overdose on abilify, effexor XR and vistaril.  There is concern that pt may also have some bipolar symptoms (see  HPI).  Patient has been dealing with depression for quite some time, going back to school-age years. Patient admits overdose was a suicide attempt.  Will hold Effexor XR, Abilify, and Vistaril.  Pt will need inpatient behavioral health admission after medically cleared.  Defer med management to inpatient psychiatrist, but consider lithium (if haven't already tried) for mood stabilization.  Lithium is thought to reduce risk for suicidality as well.   EKG sinus rhythm  QTc 404 Continue 1:1 sitter Continue IVC   Disposition: Recommend psychiatric Inpatient admission when medically cleared. Supportive therapy provided about ongoing stressors.  Hessie Knows, MD 07/05/2018 2:29 PM
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
## 2018-07-05 NOTE — ED Notes (Signed)
Pt dressed out into appropriate behavioral health clothing with this tech and Collyn,RN in the rm. Pt belongings consist of a pair of black shoes, tan pants, a black jacket, a gray shirt, a black cell phone, a black wallet, chewing tobacco, black belt and a pair of black boxers. Pt calm and cooperative while dressing out.

## 2018-07-05 NOTE — Progress Notes (Signed)
Talked to Dr. Allena Katz, Jearl Klinefelter about patient's request to have Nicotine gum instead of nicotine patch, order given. RN will continue to monitor.

## 2018-07-05 NOTE — ED Notes (Signed)
Pt is alert and oriented x4, does not appear to be experiencing any unusual side effects from overdose, no complaints other than chronic neck pain.

## 2018-07-05 NOTE — ED Notes (Signed)
Family at bedside. 

## 2018-07-05 NOTE — ED Notes (Signed)
ED TO INPATIENT HANDOFF REPORT  ED Nurse Name and Phone #: Takela Varden [phone]  S Name/Age/Gender Brandon Cox 18 y.o. adult Room/Bed: ED24A/ED24A  Code Status   Code Status: Full Code  Home/SNF/Other Home Patient oriented to: A/Ox4  Is this baseline? Yes   Triage Complete: Triage complete  Chief Complaint dosedose  Triage Note Pt here with c/o "taking a bunch of pills" including Effexor, visteril, and ambilify 30-35 pills all together around 0500 this am. States he vomited shortly (about 10 min after) he took them, he didn't induce vomiting. States he has been feeling depressed for a while now, came in because he knows he needs help. Currently having suicidal thoughts of killing himself, trying to develop a plan, denies HI.    Allergies No Known Allergies  Level of Care/Admitting Diagnosis ED Disposition    ED Disposition Condition Comment   Admit  Hospital Area: [facility] [[id number]]  Level of Care: Telemetry [5]  Diagnosis: Overdose [202577]  Admitting Physician: Alford Highland [[id number]]  Attending Physician: Alford Highland [phone]  PT Class (Do Not Modify): Observation [104]  PT Acc Code (Do Not Modify): Observation [10022]       B Medical/Surgery History Past Medical History:  Diagnosis Date  . Depression   . Fracture, cervical vertebra (HCC) 10/22/2015   c6  . History of ITP    History reviewed. No pertinent surgical history.   A IV Location/Drains/Wounds Patient Lines/Drains/Airways Status   Active Line/Drains/Airways    None          Intake/Output Last 24 hours No intake or output data in the 24 hours ending 07/05/18 1326  Labs/Imaging Results for orders placed or performed during the hospital encounter of 07/05/18 (from the past 48 hour(s))  Comprehensive metabolic panel     Status: Abnormal   Collection Time: 07/05/18 10:19 AM  Result Value Ref Range   Sodium 139 135 - 145 mmol/L   Potassium 3.7 3.5 - 5.1 mmol/L    Chloride 102 98 - 111 mmol/L   CO2 28 22 - 32 mmol/L   Glucose, Bld 122 (H) 70 - 99 mg/dL   BUN 18 6 - 20 mg/dL   Creatinine, Ser 9.83 0.61 - 1.24 mg/dL   Calcium 9.2 8.9 - 38.2 mg/dL   Total Protein 8.0 6.5 - 8.1 g/dL   Albumin 4.4 3.5 - 5.0 g/dL   AST 27 15 - 41 U/L   ALT 34 0 - 44 U/L   Alkaline Phosphatase 92 38 - 126 U/L   Total Bilirubin 0.5 0.3 - 1.2 mg/dL   GFR calc non Af Amer >60 >60 mL/min   GFR calc Af Amer >60 >60 mL/min   Anion gap 9 5 - 15    Comment: Performed at [facility], [address] [place], [place] [address]  Acetaminophen level     Status: Abnormal   Collection Time: 07/05/18 10:19 AM  Result Value Ref Range   Acetaminophen (Tylenol), Serum <10 (L) 10 - 30 ug/mL    Comment: (NOTE) Therapeutic concentrations vary significantly. A range of 10-30 ug/mL  may be an effective concentration for many patients. However, some  are best treated at concentrations outside of this range. Acetaminophen concentrations >150 ug/mL at 4 hours after ingestion  and >50 ug/mL at 12 hours after ingestion are often associated with  toxic reactions. Performed at [facility], [address] [place], [place] [address]   Ethanol     Status: None  Collection Time: 07/05/18 10:19 AM  Result Value Ref Range   Alcohol, Ethyl (B) <10 <10 mg/dL    Comment: (NOTE) Lowest detectable limit for serum alcohol is 10 mg/dL. For medical purposes only. Performed at [facility], [address] [place], [place] [address]   Salicylate level     Status: None   Collection Time: 07/05/18 10:19 AM  Result Value Ref Range   Salicylate Lvl <7.0 2.8 - 30.0 mg/dL    Comment: Performed at [facility], [address] [place], [place] [address]  CBC with Differential     Status: None   Collection Time: 07/05/18 10:19 AM  Result Value Ref Range   WBC 7.1 4.0 - 10.5 K/uL   RBC 5.58 4.22 - 5.81 MIL/uL   Hemoglobin 16.8 13.0 - 17.0 g/dL   HCT 54.5  62.5 - 63.8 %   MCV 87.6 80.0 - 100.0 fL   MCH 30.1 26.0 - 34.0 pg   MCHC 34.4 30.0 - 36.0 g/dL   RDW 93.7 34.2 - 87.6 %   Platelets 273 150 - 400 K/uL   nRBC 0.0 0.0 - 0.2 %   Neutrophils Relative % 62 %   Neutro Abs 4.3 1.7 - 7.7 K/uL   Lymphocytes Relative 28 %   Lymphs Abs 2.0 0.7 - 4.0 K/uL   Monocytes Relative 8 %   Monocytes Absolute 0.6 0.1 - 1.0 K/uL   Eosinophils Relative 2 %   Eosinophils Absolute 0.2 0.0 - 0.5 K/uL   Basophils Relative 0 %   Basophils Absolute 0.0 0.0 - 0.1 K/uL   Immature Granulocytes 0 %   Abs Immature Granulocytes 0.02 0.00 - 0.07 K/uL    Comment: Performed at [facility], [address], [place], [place] [address]   Dg Cervical Spine Complete  Result Date: 07/05/2018 CLINICAL DATA:  Chronic pain. EXAM: CERVICAL SPINE - COMPLETE 4+ VIEW COMPARISON:  No prior. FINDINGS: Mild straightening cervical spine. No acute bony abnormality identified. Neuroforamen are patent. Pulmonary apices are clear. IMPRESSION: Mild straightening cervical spine. No acute bony abnormality identified. Electronically Signed   By: Maisie Fus  Register   On: 07/05/2018 11:26    Pending Labs Unresulted Labs (From admission, onward)    Start     Ordered   07/12/18 0500  Creatinine, serum  (enoxaparin (LOVENOX)    CrCl >/= 30 ml/min)  Weekly,   STAT    Comments:  while on enoxaparin therapy    07/05/18 1310   07/06/18 0500  Basic metabolic panel  Tomorrow morning,   STAT     07/05/18 1310   07/06/18 0500  CBC  Tomorrow morning,   STAT     07/05/18 1310   07/05/18 1311  HIV antibody (Routine Testing)  Add-on,   AD     07/05/18 1310   07/05/18 1108  Magnesium  ONCE - STAT,   STAT     07/05/18 1107          Vitals/Pain Today's Vitals   07/05/18 1204 07/05/18 1215 07/05/18 1230 07/05/18 1300  BP:   (!) 149/75 (!) 143/82  Pulse:   80 76  Resp:   18 20  Temp:      TempSrc:      SpO2:   97% 98%  Weight:      Height:      PainSc: 4  4       Isolation  Precautions No active isolations  Medications Medications  enoxaparin (LOVENOX) injection  40 mg (has no administration in time range)  acetaminophen (TYLENOL) tablet 650 mg (has no administration in time range)    Or  acetaminophen (TYLENOL) suppository 650 mg (has no administration in time range)  0.9 %  sodium chloride infusion (has no administration in time range)  nicotine (NICODERM CQ - dosed in mg/24 hours) patch 14 mg (has no administration in time range)  spironolactone (ALDACTONE) tablet 12.5 mg (has no administration in time range)  estradiol (ESTRACE) tablet 2 mg (has no administration in time range)    Mobility walks Low fall risk   Focused Assessments    R Recommendations: See Admitting Provider Note  Report given to:   Additional Notes: Pt has been placed on suicidal precaution, since initial suicidal attempt was SI. Pt is A/Ox4.  Pt is calm and cooperative.

## 2018-07-05 NOTE — ED Notes (Signed)
Transported to 251 via tech with wheelchair.

## 2018-07-05 NOTE — ED Notes (Addendum)
Pt reports no SI at this time but reports initial attempt because of SI.

## 2018-07-06 ENCOUNTER — Inpatient Hospital Stay
Admission: RE | Admit: 2018-07-06 | Discharge: 2018-07-09 | DRG: 885 | Disposition: A | Discharge status: Home / Self Care | Payer: BLUE CROSS/BLUE SHIELD | Source: Intra-hospital | Attending: Psychiatry | Admitting: Psychiatry

## 2018-07-06 ENCOUNTER — Other Ambulatory Visit: Payer: Self-pay

## 2018-07-06 DIAGNOSIS — Z79899 Other long term (current) drug therapy: Secondary | ICD-10-CM

## 2018-07-06 DIAGNOSIS — F418 Other specified anxiety disorders: Secondary | ICD-10-CM | POA: Diagnosis present

## 2018-07-06 DIAGNOSIS — F649 Gender identity disorder, unspecified: Secondary | ICD-10-CM | POA: Diagnosis present

## 2018-07-06 DIAGNOSIS — Z915 Personal history of self-harm: Secondary | ICD-10-CM

## 2018-07-06 DIAGNOSIS — Z862 Personal history of diseases of the blood and blood-forming organs and certain disorders involving the immune mechanism: Secondary | ICD-10-CM | POA: Diagnosis not present

## 2018-07-06 DIAGNOSIS — F332 Major depressive disorder, recurrent severe without psychotic features: Secondary | ICD-10-CM | POA: Diagnosis present

## 2018-07-06 DIAGNOSIS — T50912A Poisoning by multiple unspecified drugs, medicaments and biological substances, intentional self-harm, initial encounter: Secondary | ICD-10-CM | POA: Diagnosis not present

## 2018-07-06 DIAGNOSIS — T50901A Poisoning by unspecified drugs, medicaments and biological substances, accidental (unintentional), initial encounter: Secondary | ICD-10-CM | POA: Diagnosis present

## 2018-07-06 LAB — CBC
HCT: 49.8 % (ref 39.0–52.0)
Hemoglobin: 16.5 g/dL (ref 13.0–17.0)
MCH: 29.7 pg (ref 26.0–34.0)
MCHC: 33.1 g/dL (ref 30.0–36.0)
MCV: 89.6 fL (ref 80.0–100.0)
PLATELETS: 246 10*3/uL (ref 150–400)
RBC: 5.56 MIL/uL (ref 4.22–5.81)
RDW: 12.2 % (ref 11.5–15.5)
WBC: 7.8 10*3/uL (ref 4.0–10.5)
nRBC: 0 % (ref 0.0–0.2)

## 2018-07-06 LAB — BASIC METABOLIC PANEL
Anion gap: 6 (ref 5–15)
BUN: 12 mg/dL (ref 6–20)
CO2: 28 mmol/L (ref 22–32)
CREATININE: 0.71 mg/dL (ref 0.61–1.24)
Calcium: 8.9 mg/dL (ref 8.9–10.3)
Chloride: 108 mmol/L (ref 98–111)
GFR calc Af Amer: >60 mL/min (ref >60)
GFR calc non Af Amer: >60 mL/min (ref >60)
Glucose, Bld: 93 mg/dL (ref 70–99)
Potassium: 3.8 mmol/L (ref 3.5–5.1)
Sodium: 142 mmol/L (ref 135–145)

## 2018-07-06 LAB — HIV ANTIBODY (ROUTINE TESTING W REFLEX): HIV Screen 4th Generation wRfx: NONREACTIVE

## 2018-07-06 MED ORDER — ESTRADIOL 2 MG PO TABS: 2.0000 mg | ORAL_TABLET | Freq: Every day | ORAL | 0 refills | Status: DC

## 2018-07-06 MED ORDER — SPIRONOLACTONE 25 MG PO TABS
12.5000 mg | ORAL_TABLET | Freq: Every day | ORAL | Status: DC
  Administered 2018-07-06 – 2018-07-08 (×3): 12.5 mg via ORAL
  Filled 2018-07-06 (×5): qty 0.5

## 2018-07-06 MED ORDER — NICOTINE POLACRILEX 4 MG MT GUM: 4.0000 mg | CHEWING_GUM | OROMUCOSAL | 0 refills | Status: DC | PRN

## 2018-07-06 MED ORDER — VENLAFAXINE HCL ER 75 MG PO CP24
75.0000 mg | ORAL_CAPSULE | Freq: Every day | ORAL | Status: DC
  Administered 2018-07-07 – 2018-07-09 (×3): 75 mg via ORAL
  Filled 2018-07-06 (×3): qty 1

## 2018-07-06 MED ORDER — ESTRADIOL 1 MG PO TABS
2.0000 mg | ORAL_TABLET | Freq: Every day | ORAL | Status: DC
  Administered 2018-07-06 – 2018-07-08 (×3): 2 mg via ORAL
  Filled 2018-07-06 (×6): qty 2

## 2018-07-06 MED ORDER — HYDROXYZINE HCL 50 MG PO TABS
50.0000 mg | ORAL_TABLET | Freq: Three times a day (TID) | ORAL | Status: DC | PRN
  Administered 2018-07-08: 50 mg via ORAL
  Filled 2018-07-06: qty 1

## 2018-07-06 MED ORDER — ACETAMINOPHEN 325 MG PO TABS
650.0000 mg | ORAL_TABLET | Freq: Four times a day (QID) | ORAL | Status: DC | PRN
  Administered 2018-07-08 – 2018-07-09 (×2): 650 mg via ORAL
  Filled 2018-07-06 (×3): qty 2

## 2018-07-06 MED ORDER — MAGNESIUM HYDROXIDE 400 MG/5ML PO SUSP: 30.0000 mL | Freq: Every day | ORAL | Status: DC | PRN

## 2018-07-06 MED ORDER — QUETIAPINE FUMARATE 100 MG PO TABS
100.0000 mg | ORAL_TABLET | Freq: Every day | ORAL | Status: DC
  Administered 2018-07-06 – 2018-07-07 (×2): 100 mg via ORAL
  Filled 2018-07-06 (×2): qty 1

## 2018-07-06 MED ORDER — TRAZODONE HCL 100 MG PO TABS
100.0000 mg | ORAL_TABLET | Freq: Every evening | ORAL | Status: DC | PRN
  Filled 2018-07-06: qty 1

## 2018-07-06 MED ORDER — NICOTINE 21 MG/24HR TD PT24
21.0000 mg | MEDICATED_PATCH | Freq: Every day | TRANSDERMAL | Status: DC
  Filled 2018-07-06 (×3): qty 1

## 2018-07-06 MED ORDER — ALUM & MAG HYDROXIDE-SIMETH 200-200-20 MG/5ML PO SUSP: 30.0000 mL | ORAL | Status: DC | PRN

## 2018-07-06 NOTE — Consult Note (Addendum)
[facility] Face-to-Face Psychiatry Consult Follow up  Reason for Consult:  Suicide attempt/overdose on Abilify, vistaril, and Effexor XR Referring Physician:  Alford Highland, MD Patient Identification: Brandon Cox MRN:  [id number] Principal Diagnosis: <principal problem not specified> Diagnosis:  Active Problems:   Overdose   Total Time spent with patient, reviewing patient's chart, discussing plan of care: 15 minutes  Subjective/HPI:  Preferred name is "Brandon Cox"   Brandon Cox "Brandon Cox" is a 18 y.o. adult transgender (male to male) patient with history of major depressive disorder and anxiety who was admitted after suicide attempt via overdose on abilify, vistaril and Effexor XR.  Pt believes she took about 30-35 tabs total around 5am on 07/05/2018.   Pt was observed on medical ward on telemetry x 24 hrs.  She's medically cleared and ready for admission to [facility].   Pt denies SI, HI, AH, VH, paranoia today.  She's hopeful she'll get on mood stabilizer for bipolar disorder.   Pt was at [facility] from Feb 15-19, 2020.  She was discharged on Effexor XR 75 mg qd, vistaril 50 mg TID PRN anxiety (minimal benefit), Abilify 5 mg qd, trazodone 50 mg qhs prn sleep, gabapentin 300 mg BID PRN anxiety (worked well for anxiety), and oxcarbazepine 150 mg BID for mood.   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   Prior suicide attempts: 12th grade-overdose on tylenol and ibuprofen (did not get hospitalized at that time); in 2018-was going to hang self or shoot self (pt states he had a gun), but was stopped by police per pt report. Also per chart review, pt has attempted to kill self several other times, including cutting self with knife, hanging self (including  attempting to hang self from balcony about 5 months ago).    Self-injurious behaviors: remote history of cutting in middle school Psychiatric Providers: [facility]"; no current psychiatrist; has seen a therapist in the past, but no one currently; was suppose to follow up at [facility] when discharged from [facility] in Feb 2020 Past Psychotropic medications: Effexor XR 75 mg qd (but worked better at 150 mg daily), vistaril (minimal benefit), Abilify 5 mg qd, trazodone 50 mg qhs prn sleep, Seroquel (too sedating), gabapentin 300 mg BID PRN anxiety (worked well for anxiety), elavil 25 mg (discontinued at [facility]), oxcarbazepine 150 mg BID  Past trauma: 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    Substance use history:  Pt denies history of illicit drug use, UDS negative upon this admission, but on Prior admissions UDS was positive for THC.  Pt denies alcohol use.   Risk to Self:  yes Risk to Others:  pt denies HI Prior Inpatient Therapy:  yes Prior Outpatient Therapy:  yes  Past Medical History:  Past Medical History:  Diagnosis Date  . Depression   . Fracture, cervical vertebra (HCC) 10/22/2015   c6  . History of ITP    History reviewed. No pertinent surgical history. Family History:  Family History  Problem Relation Age of Onset  . Healthy Mother   . Drug abuse Cousin    Family Psychiatric  History: Cousin with substance abuse Social History:  Social History   Substance and Sexual Activity  Alcohol Use No  . Frequency: Never     Social History   Substance and Sexual Activity  Drug Use Yes  . Types: Marijuana   Comment: used a couple of times in the last couple weeks    Social History   Socioeconomic History  . Marital status: Single    Spouse name: Not on file  . Number of children: 0   . Years of education: Not on file  . Highest education level: High school graduate  Occupational History  . Occupation: [employer]    Comment: part time  Social Needs  . Financial resource strain: Very hard  . Food insecurity:    Worry: Never true    Inability: Never true  . Transportation needs:    Medical: No    Non-medical: No  Tobacco Use  . Smoking status: Current Every Day Smoker    Packs/day: 1.00    Types: Cigarettes  . Smokeless tobacco: Current User    Types: Chew  . Tobacco comment: 1 pack per week  Substance and Sexual Activity  . Alcohol use: No    Frequency: Never  . Drug use: Yes    Types: Marijuana    Comment: used a couple of times in the last couple weeks  . Sexual activity: Not Currently  Lifestyle  . Physical activity:    Days per week: 0 days    Minutes per session: 0 min  . Stress: Very much  Relationships  . Social connections:    Talks on phone: Once a week    Gets together: More than three times a week    Attends religious service: Never    Active member of club or organization: No    Attends meetings of clubs or organizations: Never    Relationship status: Never married  Other Topics Concern  . Not on file  Social History Narrative  . Not on file   Additional Social History: Lives in [place], [place] with parents; has 2 older sisters.  Single, transgender (male to male); no children.  Currently enrolled in online school "[employer]"-getting associates degree in Health and Human Services Religious Beliefs: Pagan Employment: currently unemployed as of 2 days b/c fired from [employer] job at [employer] b/c "couldn't get out of bed due to depression" Hobbies: playing with her dogs Specify valuables returned: clothing  Allergies:  No Known Allergies  Labs:  Results for orders placed or performed during the hospital encounter of 07/05/18 (from the past 48 hour(s))  Comprehensive metabolic panel     Status: Abnormal    Collection Time: 07/05/18 10:19 AM  Result Value Ref Range   Sodium 139 135 - 145 mmol/L   Potassium 3.7 3.5 - 5.1 mmol/L   Chloride 102 98 - 111 mmol/L   CO2 28 22 - 32 mmol/L   Glucose, Bld 122 (H) 70 - 99 mg/dL   BUN 18 6 - 20 mg/dL   Creatinine, Ser 7.12 0.61 - 1.24 mg/dL   Calcium 9.2 8.9 - 45.8 mg/dL   Total Protein 8.0 6.5 - 8.1 g/dL   Albumin 4.4 3.5 - 5.0 g/dL   AST 27 15 - 41 U/L   ALT 34 0 - 44 U/L   Alkaline Phosphatase 92 38 - 126 U/L   Total Bilirubin 0.5 0.3 - 1.2 mg/dL   GFR calc non Af Amer >60 >60 mL/min   GFR calc Af Amer >60 >60 mL/min  Anion gap 9 5 - 15    Comment: Performed at [facility], [address] [place], [place] [address]  Acetaminophen level     Status: Abnormal   Collection Time: 07/05/18 10:19 AM  Result Value Ref Range   Acetaminophen (Tylenol), Serum <10 (L) 10 - 30 ug/mL    Comment: (NOTE) Therapeutic concentrations vary significantly. A range of 10-30 ug/mL  may be an effective concentration for many patients. However, some  are best treated at concentrations outside of this range. Acetaminophen concentrations >150 ug/mL at 4 hours after ingestion  and >50 ug/mL at 12 hours after ingestion are often associated with  toxic reactions. Performed at [facility], [address] [place], [place] [address]   Ethanol     Status: None   Collection Time: 07/05/18 10:19 AM  Result Value Ref Range   Alcohol, Ethyl (B) <10 <10 mg/dL    Comment: (NOTE) Lowest detectable limit for serum alcohol is 10 mg/dL. For medical purposes only. Performed at [facility], [address] [place], [place] [address]   Salicylate level     Status: None   Collection Time: 07/05/18 10:19 AM  Result Value Ref Range   Salicylate Lvl <7.0 2.8 - 30.0 mg/dL    Comment: Performed at [facility], [address] [place], [place] [address]  CBC with Differential     Status: None   Collection Time: 07/05/18 10:19 AM   Result Value Ref Range   WBC 7.1 4.0 - 10.5 K/uL   RBC 5.58 4.22 - 5.81 MIL/uL   Hemoglobin 16.8 13.0 - 17.0 g/dL   HCT 29.548.9 62.139.0 - 30.852.0 %   MCV 87.6 80.0 - 100.0 fL   MCH 30.1 26.0 - 34.0 pg   MCHC 34.4 30.0 - 36.0 g/dL   RDW 65.712.3 84.611.5 - 96.215.5 %   Platelets 273 150 - 400 K/uL   nRBC 0.0 0.0 - 0.2 %   Neutrophils Relative % 62 %   Neutro Abs 4.3 1.7 - 7.7 K/uL   Lymphocytes Relative 28 %   Lymphs Abs 2.0 0.7 - 4.0 K/uL   Monocytes Relative 8 %   Monocytes Absolute 0.6 0.1 - 1.0 K/uL   Eosinophils Relative 2 %   Eosinophils Absolute 0.2 0.0 - 0.5 K/uL   Basophils Relative 0 %   Basophils Absolute 0.0 0.0 - 0.1 K/uL   Immature Granulocytes 0 %   Abs Immature Granulocytes 0.02 0.00 - 0.07 K/uL    Comment: Performed at [facility], [address] [place], [place] [address]  Magnesium     Status: None   Collection Time: 07/05/18  2:34 PM  Result Value Ref Range   Magnesium 2.0 1.7 - 2.4 mg/dL    Comment: Performed at [facility], [address] [place], [place] [address]  HIV antibody (Routine Testing)     Status: None   Collection Time: 07/05/18  2:34 PM  Result Value Ref Range   HIV Screen 4th Generation wRfx Non Reactive Non Reactive    Comment: (NOTE) Performed At: [facility] [address] [place], [place] [id number] Jolene SchimkeNagendra Sanjai MD UY:[id number]:[phone]   Urine Drug Screen, Qualitative ([facility] only)     Status: None   Collection Time: 07/05/18  5:15 PM  Result Value Ref Range   Tricyclic, Ur Screen NONE DETECTED NONE DETECTED   Amphetamines, Ur Screen NONE DETECTED NONE DETECTED   MDMA (Ecstasy)Ur Screen NONE DETECTED NONE DETECTED   Cocaine  Metabolite,Ur [place] NONE DETECTED NONE DETECTED   Opiate, Ur Screen NONE DETECTED NONE DETECTED   Phencyclidine (PCP) Ur S NONE DETECTED NONE DETECTED   Cannabinoid 50 Ng, Ur [address] NONE DETECTED NONE DETECTED   Barbiturates, Ur Screen NONE DETECTED NONE DETECTED   Benzodiazepine, Ur Scrn NONE DETECTED NONE  DETECTED   Methadone Scn, Ur NONE DETECTED NONE DETECTED    Comment: (NOTE) Tricyclics + metabolites, urine    Cutoff 1000 ng/mL Amphetamines + metabolites, urine  Cutoff 1000 ng/mL MDMA (Ecstasy), urine              Cutoff 500 ng/mL Cocaine Metabolite, urine          Cutoff 300 ng/mL Opiate + metabolites, urine        Cutoff 300 ng/mL Phencyclidine (PCP), urine         Cutoff 25 ng/mL Cannabinoid, urine                 Cutoff 50 ng/mL Barbiturates + metabolites, urine  Cutoff 200 ng/mL Benzodiazepine, urine              Cutoff 200 ng/mL Methadone, urine                   Cutoff 300 ng/mL The urine drug screen provides only a preliminary, unconfirmed analytical test result and should not be used for non-medical purposes. Clinical consideration and professional judgment should be applied to any positive drug screen result due to possible interfering substances. A more specific alternate chemical method must be used in order to obtain a confirmed analytical result. Gas chromatography / mass spectrometry (GC/MS) is the preferred confirmat ory method. Performed at [facility], [address] [place], [place] [address]   Basic metabolic panel     Status: None   Collection Time: 07/06/18  5:12 AM  Result Value Ref Range   Sodium 142 135 - 145 mmol/L   Potassium 3.8 3.5 - 5.1 mmol/L   Chloride 108 98 - 111 mmol/L   CO2 28 22 - 32 mmol/L   Glucose, Bld 93 70 - 99 mg/dL   BUN 12 6 - 20 mg/dL   Creatinine, Ser 4.09 0.61 - 1.24 mg/dL   Calcium 8.9 8.9 - 81.1 mg/dL   GFR calc non Af Amer >60 >60 mL/min   GFR calc Af Amer >60 >60 mL/min   Anion gap 6 5 - 15    Comment: Performed at [facility], [address] [place], [place] [address]  CBC     Status: None   Collection Time: 07/06/18  5:12 AM  Result Value Ref Range   WBC 7.8 4.0 - 10.5 K/uL   RBC 5.56 4.22 - 5.81 MIL/uL   Hemoglobin 16.5 13.0 - 17.0 g/dL   HCT 29.5 62.1 - 30.8 %   MCV 89.6 80.0 - 100.0 fL    MCH 29.7 26.0 - 34.0 pg   MCHC 33.1 30.0 - 36.0 g/dL   RDW 65.7 84.6 - 96.2 %   Platelets 246 150 - 400 K/uL   nRBC 0.0 0.0 - 0.2 %    Comment: Performed at [facility], [address], [place], [place] [address]    Current Facility-Administered Medications  Medication Dose Route Frequency Provider Last Rate Last Dose  . 0.9 %  sodium chloride infusion   Intravenous Continuous Alford Highland, MD 50 mL/hr at 07/06/18 [phone]    . acetaminophen (TYLENOL) tablet 650 mg  650 mg Oral Q6H  PRN Alford Highland, MD   650 mg at 07/05/18 2112   Or  . acetaminophen (TYLENOL) suppository 650 mg  650 mg Rectal Q6H PRN Alford Highland, MD      . enoxaparin (LOVENOX) injection 40 mg  40 mg Subcutaneous Q24H Wieting, Richard, MD      . estradiol (ESTRACE) tablet 2 mg  2 mg Oral Daily Alford Highland, MD   2 mg at 07/06/18 0959  . nicotine polacrilex (NICORETTE) gum 4 mg  4 mg Oral Q4H PRN Enedina Finner, MD   4 mg at 07/06/18 0737  . spironolactone (ALDACTONE) tablet 12.5 mg  12.5 mg Oral Daily Alford Highland, MD   12.5 mg at 07/06/18 5916   Current Outpatient Medications  Medication Sig Dispense Refill  . acetaminophen (TYLENOL) 325 MG tablet Take 2 tablets (650 mg total) by mouth every 6 (six) hours as needed for mild pain. (May buy over the counter) 1 tablet 0  . gabapentin (NEURONTIN) 300 MG capsule Take 1 capsule (300 mg total) by mouth 2 (two) times daily as needed (anxiety). 60 capsule 0  . OXcarbazepine (TRILEPTAL) 150 MG tablet Take 1 tablet (150 mg total) by mouth 2 (two) times daily. For mood 60 tablet 0  . spironolactone (ALDACTONE) 25 MG tablet Take 0.5 tablets (12.5 mg total) by mouth daily. For gender transition 30 tablet 0  . traZODone (DESYREL) 50 MG tablet Take 1 tablet (50 mg total) by mouth at bedtime as needed and may repeat dose one time if needed for sleep. 30 tablet 0  . [START ON 07/07/2018] estradiol (ESTRACE) 2 MG tablet Take 1 tablet (2 mg total) by mouth daily. 30  tablet 0  . nicotine polacrilex (NICORETTE) 4 MG gum Take 1 each (4 mg total) by mouth every 4 (four) hours as needed for smoking cessation. 100 tablet 0    Musculoskeletal: Strength & Muscle Tone: within normal limits Gait & Station: did not see ambulate Patient leans: N/A  Psychiatric Specialty Exam: Physical Exam  Nursing note and vitals reviewed. Constitutional: She is oriented to person, place, and time. She appears well-developed and well-nourished. No distress.  HENT:  Head: Normocephalic and atraumatic.  Eyes: Pupils are equal, round, and reactive to light.  Neck: Normal range of motion.  Respiratory: Effort normal.  GI: Bowel sounds are normal.  Musculoskeletal: Normal range of motion.  Neurological: She is alert and oriented to person, place, and time.  Skin: Skin is warm and dry. She is not diaphoretic.  Psychiatric: Her behavior is normal. Her mood appears not anxious. Cognition and memory are normal. She exhibits a depressed mood. She expresses suicidal ideation. She expresses no homicidal ideation.    Review of Systems  Constitutional: Negative.   HENT: Negative.   Eyes: Negative.   Respiratory: Negative.   Cardiovascular: Negative.   Gastrointestinal: Negative.   Genitourinary: Negative.   Musculoskeletal: Negative.   Neurological: Negative.   Psychiatric/Behavioral: Positive for depression. Negative for hallucinations, memory loss, substance abuse and suicidal ideas. The patient is nervous/anxious. The patient does not have insomnia.     Blood pressure 120/64, pulse 64, temperature 99.1 F (37.3 C), temperature source Oral, resp. rate 19, height 6\' 1"  (1.854 m), weight 102.1 kg, SpO2 97 %.Body mass index is 29.69 kg/m.  General Appearance: transgender (male to male); hair dyed green; well nourished  Eye Contact:  Good  Speech:  speech disorder (speech apraxia)  Volume:  Normal  Mood:  Depressed  Affect:  Non-Congruent and pt appeared  brighter; smiled  appropriately  Thought Process:  Coherent and Linear  Orientation:  Full (Time, Place, and Person)  Thought Content:  Logical  Suicidal Thoughts:  Denies SI today  Homicidal Thoughts:  pt denies HI  Memory:  grossly intact  Judgement:  Intact  Insight:  Present  Psychomotor Activity:  Normal  Concentration:  Concentration: Fair and Attention Span: Fair  Recall:  [employer] of Knowledge:  Fair  Language:  fair; speech apraxia  Akathisia:  No  AIMS (if indicated):     Assets:  Desire for Improvement Physical Health Resilience  ADL's:  Intact  Cognition:  WNL  Sleep:   fair     Treatment Plan Summary: Daily contact with patient to assess and evaluate symptoms and progress in treatment, Medication management and Plan   18 yo transgender patient (male to male) with history of depression and anxiety admitted after intentional overdose on abilify, effexor XR and vistaril.  There is concern that pt may also have some bipolar symptoms (see HPI from 07/05/2018).  Patient has been dealing with depression for quite some time, going back to school-age years. Patient admits overdose was a suicide attempt.  Will hold Effexor XR, Abilify, and Vistaril.  Pt will need inpatient behavioral health admission after medically cleared.  Defer med management to inpatient psychiatrist, but consider lithium for mood stabilization.  Lithium is thought to reduce risk for suicidality as well.   EKG 07/06/2018 QTc 401 Continue 1:1 sitter, until on psych unit, then can go to routine q 15 min checks, as pt currently denying SI. Continue IVC   Disposition: Recommend psychiatric Inpatient admission when medically cleared. Supportive therapy provided about ongoing stressors.  Hessie Knows, MD 07/06/2018 11:44 AM

## 2018-07-06 NOTE — Discharge Summary (Signed)
[facility] - [place] at [facility]   PATIENT NAME: Oland Arquette  "[name]"  MR#:  [id number]  DATE OF BIRTH:  2000/11/04  DATE OF ADMISSION:  07/05/2018   ADMITTING PHYSICIAN: Alford Highland, MD  DATE OF DISCHARGE: 07/06/18  PRIMARY CARE PHYSICIAN: Patient, No Pcp Per   ADMISSION DIAGNOSIS:  overdose DISCHARGE DIAGNOSIS:  Active Problems:   Overdose  SECONDARY DIAGNOSIS:   Past Medical History:  Diagnosis Date  . Depression   . Fracture, cervical vertebra (HCC) 10/22/2015   c6  . History of ITP    HOSPITAL COURSE:   [name] Purdom  is a 18 y.o. male to male patient on hormonal transition therapy with a known history of anxiety, depression, insomnia, and possible bipolar disorder and PTSD.  She brought herself to the hospital after taking an overdose of pills at 5 AM.  She states she took 30-35 pills of Effexor Abilify and Vistaril. She vomited afterwards and she saw some pills.  She brought herself to the emergency room. Physically she feels okay.  ER physician spoke with poison control and recommended watching on telemetry and serial EKGs since she overdosed on Effexor. No acute changes. Was previously admitted to [facility] behavioral health in February 2020.  On 07/06/18 medically-cleared for transfer to the behavioral health unit and transfer accepted by Dr. Toni Amend. Remained asymptomatic physically with reassuring exam, labs, telemetry.   1.  Drug overdose.  Poison control recommended monitoring for 24 hours on telemetry since she overdosed on Effexor.  Monitored EKG and telemetry monitoring. Stable.  2.  Suicide attempt but she brought herself to the hospital. psychiatric consultation: seen by Dr. Flora Lipps who recommended transfer to the behavioral health unit once medically-stable. ER physician did IVC papers. 3.  Neck pain.  Cervical spine x-ray negative. Impression is of muscle pain or spasm with no acute process vs. Residual pain from historical fracture.  Continue PRN pain control, hot packs as needed. 4.  Hormonal Male to male transition: estradiol and spironolactone. Refills were provided where indicated. 5.  Anxiety, depression, PTSD, insomnia: Hold Effexor, Vistaril, Abilify. Concern for bipolar disorder and per Dr. Flora Lipps there may be a consideration of Lithium, deferred to inpatient physician on the behavioral health unit.   DISCHARGE CONDITIONS:  stable CONSULTS OBTAINED:  Treatment Team:  Clapacs, Jackquline Denmark, MD DRUG ALLERGIES:  No Known Allergies DISCHARGE MEDICATIONS:   Allergies as of 07/06/2018   No Known Allergies     Medication List    STOP taking these medications   ARIPiprazole 5 MG tablet Commonly known as:  ABILIFY   hydrOXYzine 50 MG tablet Commonly known as:  ATARAX/VISTARIL   venlafaxine XR 75 MG 24 hr capsule Commonly known as:  EFFEXOR-XR     TAKE these medications   acetaminophen 325 MG tablet Commonly known as:  TYLENOL Take 2 tablets (650 mg total) by mouth every 6 (six) hours as needed for mild pain. (May buy over the counter)   estradiol 2 MG tablet Commonly known as:  ESTRACE Take 1 tablet (2 mg total) by mouth daily. Start taking on:  July 07, 2018 What changed:    medication strength  additional instructions   gabapentin 300 MG capsule Commonly known as:  NEURONTIN Take 1 capsule (300 mg total) by mouth 2 (two) times daily as needed (anxiety).   nicotine polacrilex 4 MG gum Commonly known as:  NICORETTE Take 1 each (4 mg total) by mouth every 4 (four) hours as needed for smoking cessation.  What changed:    medication strength  how much to take  when to take this   OXcarbazepine 150 MG tablet Commonly known as:  TRILEPTAL Take 1 tablet (150 mg total) by mouth 2 (two) times daily. For mood   spironolactone 25 MG tablet Commonly known as:  ALDACTONE Take 0.5 tablets (12.5 mg total) by mouth daily. For gender transition   traZODone 50 MG tablet Commonly known as:  DESYREL  Take 1 tablet (50 mg total) by mouth at bedtime as needed and may repeat dose one time if needed for sleep.        DISCHARGE INSTRUCTIONS:   DIET:  Regular diet DISCHARGE CONDITION:  Stable ACTIVITY:  Activity as tolerated OXYGEN:  Home Oxygen: No.  Oxygen Delivery: room air DISCHARGE LOCATION:  Transfer to [facility] Unit.    If you experience worsening of your admission symptoms, develop shortness of breath, life threatening emergency, suicidal or homicidal thoughts you must seek medical attention immediately by calling 911 or calling your MD immediately if your symptoms are severe.  You Must read complete instructions/literature along with all the possible adverse reactions/side effects for all the medicines you take and that have been prescribed to you. Take any new medicines only after you have completely understood and accept all the possible adverse reactions/side effects.   Please note  You were cared for by a hospitalist during your hospital stay. If you have any questions about your discharge medications or the care you received while you were in the hospital after you are discharged, you can call the unit and asked to speak with the hospitalist on call if the hospitalist that took care of you is not available. Once you are discharged, your primary care physician will handle any further medical issues. Please note that NO REFILLS for any discharge medications will be authorized once you are discharged, as it is imperative that you return to your primary care physician (or establish a relationship with a primary care physician if you do not have one) for your aftercare needs so that they can reassess your need for medications and monitor your lab values.    On the day of Discharge:  VITAL SIGNS:  Blood pressure 124/70, pulse 66, temperature 97.7 F (36.5 C), temperature source Oral, resp. rate 19, height 6\' 1"  (1.854 m), weight 102.1 kg, SpO2 100 %. PHYSICAL  EXAMINATION:  GENERAL:  18 y.o.-year-old patient lying in the bed with no acute distress.  EYES: Pupils, round, reactive to light and accommodation. No scleral icterus. Extraocular muscles intact. Left pupil slightly more dilated than the right pupil but both reactive to light and extra ocular muscles intact. No scleral icterus.  HEENT: Head atraumatic, normocephalic. Oropharynx and nasopharynx clear.  NECK:  Supple, no jugular venous distention. No thyroid enlargement, no tenderness.  LUNGS: Normal breath sounds bilaterally, no wheezing, rales,rhonchi or crepitation. No use of accessory muscles of respiration.  CARDIOVASCULAR: S1, S2 normal. No murmurs, rubs, or gallops.  ABDOMEN: Soft, non-tender, non-distended. Bowel sounds present. No organomegaly or mass.  EXTREMITIES: No pedal edema, cyanosis, or clubbing.  NEUROLOGIC: Cranial nerves II through XII are intact. Muscle strength 5/5 in all extremities. Sensation intact. Gait not checked.  PSYCHIATRIC: The patient is alert and oriented x 3.  SKIN: No obvious rash, lesion, or ulcer.  DATA REVIEW:   CBC Recent Labs  Lab 07/06/18 0512  WBC 7.8  HGB 16.5  HCT 49.8  PLT 246    Chemistries  Recent Labs  Lab 07/05/18 1019 07/05/18 1434 07/06/18 0512  NA 139  --  142  K 3.7  --  3.8  CL 102  --  108  CO2 28  --  28  GLUCOSE 122*  --  93  BUN 18  --  12  CREATININE 0.85  --  0.71  CALCIUM 9.2  --  8.9  MG  --  2.0  --   AST 27  --   --   ALT 34  --   --   ALKPHOS 92  --   --   BILITOT 0.5  --   --      Microbiology Results  No results found for this or any previous visit.  RADIOLOGY:  Dg Cervical Spine Complete  Result Date: 07/05/2018 CLINICAL DATA:  Chronic pain. EXAM: CERVICAL SPINE - COMPLETE 4+ VIEW COMPARISON:  No prior. FINDINGS: Mild straightening cervical spine. No acute bony abnormality identified. Neuroforamen are patent. Pulmonary apices are clear. IMPRESSION: Mild straightening cervical spine. No acute bony  abnormality identified. Electronically Signed   By: Maisie Fus  Register   On: 07/05/2018 11:26    Management plans discussed with the patient and/or family and they are in agreement.  CODE STATUS: Full Code   TOTAL TIME TAKING CARE OF THIS PATIENT: 45 minutes.    Nadalie Laughner PA-C on 07/06/2018 at 10:19 AM  Between 7am to 6pm - Pager - [phone]  After 6pm go to [email or web address] - Social research officer, government  [facility]  Office  [phone]  CC: Primary care physician; Patient, No Pcp Per

## 2018-07-06 NOTE — Tx Team (Signed)
Initial Treatment Plan 07/06/2018 1:02 PM MARIN KOELLE GAY:[id number]    PATIENT STRESSORS: Financial difficulties Marital or family conflict   PATIENT STRENGTHS: Average or above average intelligence Communication skills Motivation for treatment/growth Physical Health   PATIENT IDENTIFIED PROBLEMS: Family conflict  Financial problems                    DISCHARGE CRITERIA:  Ability to meet basic life and health needs Improved stabilization in mood, thinking, and/or behavior Medical problems require only outpatient monitoring Verbal commitment to aftercare and medication compliance  PRELIMINARY DISCHARGE PLAN: Outpatient therapy Return to previous living arrangement Return to previous work or school arrangements  PATIENT/FAMILY INVOLVEMENT: This treatment plan has been presented to and reviewed with the patient, Brandon Cox.  The patient has been given the opportunity to ask questions and make suggestions.  Chalmers Cater, RN 07/06/2018, 1:02 PM

## 2018-07-06 NOTE — Progress Notes (Signed)
IVC order DC'd to transfer her to psychiatry.

## 2018-07-06 NOTE — Progress Notes (Signed)
Recreation Therapy Notes  INPATIENT RECREATION THERAPY ASSESSMENT  Patient Details Name: Brandon Cox MRN: [id number] DOB: 01/26/2001 Today's Date: 07/06/2018       Information Obtained From: Patient  Able to Participate in Assessment/Interview: Yes  Patient Presentation: Responsive  Reason for Admission (Per Patient): Active Symptoms, Other (Comments)(Depression)  Patient Stressors:    Coping Skills:   Other (Comment)(rub my dog)  Leisure Interests (2+):  Individual - TV(Sleep)  Frequency of Recreation/Participation: Monthly  Awareness of Community Resources:     [employer]:     Current Use:    If no, Barriers?:    Expressed Interest in [employer] Information:    [place] of Residence:  Film/video editor  Patient Main Form of Transportation: Set designer  Patient Strengths:  Funny  Patient Identified Areas of Improvement:  N/A  Patient Goal for Hospitalization:  Get over my craving to smoke  Current SI (including self-harm):  No  Current HI:  No  Current AVH: No  Staff Intervention Plan: Group Attendance, Collaborate with Interdisciplinary Treatment Team  Consent to Intern Participation: N/A  Brandon Cox 07/06/2018, 3:18 PM

## 2018-07-06 NOTE — B[facility] Group Notes (Signed)
Feelings Around Relapse 07/06/2018 1PM  Type of Therapy and Topic:  Group Therapy:  Feelings around Relapse and Recovery  Participation Level:  Active   Description of Group:    Patients in this group will discuss emotions they experience before and after a relapse. They will process how experiencing these feelings, or avoidance of experiencing them, relates to having a relapse. Facilitator will guide patients to explore emotions they have related to recovery. Patients will be encouraged to process which emotions are more powerful. They will be guided to discuss the emotional reaction significant others in their lives may have to patients' relapse or recovery. Patients will be assisted in exploring ways to respond to the emotions of others without this contributing to a relapse.  Therapeutic Goals: 1. Patient will identify two or more emotions that lead to a relapse for them 2. Patient will identify two emotions that result when they relapse 3. Patient will identify two emotions related to recovery 4. Patient will demonstrate ability to communicate their needs through discussion and/or role plays   Summary of Patient Progress:  Actively and appropriately engaged in the group. Patient was able to provide support and validation to other group members.Patient practiced active listening when interacting with the facilitator and other group members. Patient discussed his feelings around the times he attempted to harm himself. Patient identified triggers that lead to relapse.    Therapeutic Modalities:   Cognitive Behavioral Therapy Solution-Focused Therapy Assertiveness Training Relapse Prevention Therapy   Suzan Slick, LCSW 07/06/2018 2:06 PM

## 2018-07-06 NOTE — Plan of Care (Signed)
Pt denies SI, hallucinations, depressions and anxiety. Pt was educated and oriented on the unit. Torrie Mayers RN Problem: Education: Goal: Charity fundraiser Education information/materials will improve Outcome: Progressing   Problem: Education: Goal: Verbalization of understanding the information provided will improve Outcome: Progressing   Problem: Health Behavior/Discharge Planning: Goal: Identification of resources available to assist in meeting health care needs will improve Outcome: Progressing   Problem: Safety: Goal: Periods of time without injury will increase Outcome: Progressing

## 2018-07-06 NOTE — [facility] Suicide Risk Assessment (Signed)
[facility] Admission Suicide Risk Assessment   Nursing information obtained from:    Demographic factors:  Male, Caucasian, Cardell Peach, lesbian, or bisexual orientation Current Mental Status:  (calm , cooperative ) Loss Factors:  Financial problems / change in socioeconomic status(family problems ) Historical Factors:  (lack of social support ) Risk Reduction Factors:  Positive coping skills or problem solving skills(school)  Total Time spent with patient: 1 hour Principal Problem: <principal problem not specified> Diagnosis:  Active Problems:   Severe recurrent major depression without psychotic features (HCC)  Subjective Data: Patient seen chart reviewed.  Patient transferred from the medical floor after recovering from an overdose.  Patient came into the hospital after taking an intentional overdose of medication with suicidal ideation.  Today he denies any suicidal wish intent or plan.  Denies homicidal ideation.  Continues to endorse sadness.  Not having any hallucinations or psychotic symptoms.  Open and willing to participate in appropriate treatment  Continued Clinical Symptoms:  Alcohol Use Disorder Identification Test Final Score (AUDIT): 0 The "Alcohol Use Disorders Identification Test", Guidelines for Use in Primary Care, Second Edition.  World Science writer [facility]). Score between 0-7:  no or low risk or alcohol related problems. Score between 8-15:  moderate risk of alcohol related problems. Score between 16-19:  high risk of alcohol related problems. Score 20 or above:  warrants further diagnostic evaluation for alcohol dependence and treatment.   CLINICAL FACTORS:   Depression:   Impulsivity   Musculoskeletal: Strength & Muscle Tone: within normal limits Gait & Station: normal Patient leans: N/A  Psychiatric Specialty Exam: Physical Exam  Nursing note and vitals reviewed. Constitutional: She appears well-developed and well-nourished.  HENT:  Head: Normocephalic and  atraumatic.  Eyes: Pupils are equal, round, and reactive to light. Conjunctivae are normal.  Neck: Normal range of motion.  Cardiovascular: Regular rhythm and normal heart sounds.  Respiratory: Effort normal. No respiratory distress.  GI: Soft.  Musculoskeletal: Normal range of motion.  Neurological: She is alert.  Skin: Skin is warm and dry.  Psychiatric: Judgment normal. Her affect is blunt. Her speech is delayed. She is slowed. Thought content is not paranoid. Cognition and memory are normal. She expresses no homicidal and no suicidal ideation.    Review of Systems  Constitutional: Negative.   HENT: Negative.   Eyes: Negative.   Respiratory: Negative.   Cardiovascular: Negative.   Gastrointestinal: Negative.   Musculoskeletal: Negative.   Skin: Negative.   Neurological: Negative.   Psychiatric/Behavioral: Positive for depression. Negative for hallucinations, memory loss, substance abuse and suicidal ideas. The patient is nervous/anxious. The patient does not have insomnia.     Blood pressure 125/75, pulse 75, temperature 98.4 F (36.9 C), temperature source Oral, resp. rate 18, height 6\' 1"  (1.854 m), weight 100.3 kg, SpO2 100 %.Body mass index is 29.17 kg/m.  General Appearance: Casual  Eye Contact:  Good  Speech:  Clear and Coherent  Volume:  Normal  Mood:  Dysphoric  Affect:  Congruent  Thought Process:  Goal Directed  Orientation:  Full (Time, Place, and Person)  Thought Content:  Logical  Suicidal Thoughts:  No  Homicidal Thoughts:  No  Memory:  Immediate;   Fair Recent;   Fair Remote;   Fair  Judgement:  Fair  Insight:  Fair  Psychomotor Activity:  Normal  Concentration:  Concentration: Fair  Recall:  [employer] of Knowledge:  Fair  Language:  Fair  Akathisia:  No  Handed:  Right  AIMS (if indicated):  Assets:  Desire for Improvement Housing Physical Health Resilience Social Support  ADL's:  Intact  Cognition:  WNL  Sleep:         COGNITIVE  FEATURES THAT CONTRIBUTE TO RISK:  Polarized thinking    SUICIDE RISK:   Mild:  Suicidal ideation of limited frequency, intensity, duration, and specificity.  There are no identifiable plans, no associated intent, mild dysphoria and related symptoms, good self-control (both objective and subjective assessment), few other risk factors, and identifiable protective factors, including available and accessible social support.  PLAN OF CARE: Patient admitted to the psychiatric service.  15-minute checks in place.  Full evaluation by treatment team.  Labs reviewed.  Discussed with patient appropriate medication treatment and will initiate some medicines today.  Continue daily observation and reassessment by psychiatry and social work and Education administrator.  Make sure we have improved his mood and have a safe and stable discharge plan.  I certify that inpatient services furnished can reasonably be expected to improve the patient's condition.   Mordecai Rasmussen, MD 07/06/2018, 4:55 PM

## 2018-07-06 NOTE — H&P (Signed)
Psychiatric Admission Assessment Adult  Patient Identification: Brandon Cox MRN:  [id number] Date of Evaluation:  07/06/2018 Chief Complaint:  major depression Principal Diagnosis: Severe recurrent major depression without psychotic features (HCC) Diagnosis:  Principal Problem:   Severe recurrent major depression without psychotic features (HCC) Active Problems:   Overdose  History of Present Illness: Patient seen chart reviewed.  Labs reviewed.  This is a 18 year old man with a history of depression who was transferred to us from the medical service after recovering from an overdose.  Patient came into the hospital on his own volition sometime after having taken an overdose of a combination of his own psychiatric medicine.  Patient says he will sometimes get in an extreme depression where things will seem hopeless and he will impulsively overdose or do other things to harm himself.  He could not identify for me any specific stressor that pushed him to that point.  He did endorse that his mood is been down and depressed further at least the last couple months.  Sleep and appetite are normal.  Denies any psychotic symptoms.  He has been compliant with his outpatient psychiatric medications which were Abilify Effexor and Trileptal but he has not been seeing an outpatient provider regularly.  Major life stress is identified as his bad relationship with his parents who do not support his decision to identify as transgender.  Patient denies alcohol or drug abuse Associated Signs/Symptoms: Depression Symptoms:  depressed mood, feelings of worthlessness/guilt, hopelessness, suicidal attempt, (Hypo) Manic Symptoms:  Impulsivity, Anxiety Symptoms:  Excessive Worry, Psychotic Symptoms:  None PTSD Symptoms: Negative Total Time spent with patient: 1 hour  Past Psychiatric History: Patient has a history of depression and has had several hospitalizations.  Most recently he was in behavioral health  Hospital just last month.  His last suicide attempt by his recollection was last autumn.  There have been several of them and also a past history of cutting.  Patient has been on multiple antidepressant and mood stabilizing medications.  He says they will all be of some help transiently but then seem to wear off.  He remembers Seroquel having been particularly helpful.  No history of violence no history of psychosis.  Is the patient at risk to self? Yes.    Has the patient been a risk to self in the past 6 months? Yes.    Has the patient been a risk to self within the distant past? Yes.    Is the patient a risk to others? No.  Has the patient been a risk to others in the past 6 months? No.  Has the patient been a risk to others within the distant past? No.   Prior Inpatient Therapy:   Prior Outpatient Therapy:    Alcohol Screening: 1. How often do you have a drink containing alcohol?: Never 2. How many drinks containing alcohol do you have on a typical day when you are drinking?: 1 or 2 3. How often do you have six or more drinks on one occasion?: Never AUDIT-C Score: 0 9. Have you or someone else been injured as a result of your drinking?: No 10. Has a relative or friend or a doctor or another health worker been concerned about your drinking or suggested you cut down?: No Alcohol Use Disorder Identification Test Final Score (AUDIT): 0 Alcohol Brief Interventions/Follow-up: AUDIT Score <7 follow-up not indicated Substance Abuse History in the last 12 months:  No. Consequences of Substance Abuse: Negative Previous Psychotropic Medications: Yes  Psychological Evaluations: Yes  Past Medical History:  Past Medical History:  Diagnosis Date  . Depression   . Fracture, cervical vertebra (HCC) 10/22/2015   c6  . History of ITP    History reviewed. No pertinent surgical history. Family History:  Family History  Problem Relation Age of Onset  . Healthy Mother   . Drug abuse Cousin     Family Psychiatric  History: Just a little substance abuse and one relative nothing else remarkable Tobacco Screening:   Social History:  Social History   Substance and Sexual Activity  Alcohol Use No  . Frequency: Never     Social History   Substance and Sexual Activity  Drug Use Yes  . Types: Marijuana   Comment: used a couple of times in the last couple weeks    Additional Social History:                           Allergies:  No Known Allergies Lab Results:  Results for orders placed or performed during the hospital encounter of 07/05/18 (from the past 48 hour(s))  Comprehensive metabolic panel     Status: Abnormal   Collection Time: 07/05/18 10:19 AM  Result Value Ref Range   Sodium 139 135 - 145 mmol/L   Potassium 3.7 3.5 - 5.1 mmol/L   Chloride 102 98 - 111 mmol/L   CO2 28 22 - 32 mmol/L   Glucose, Bld 122 (H) 70 - 99 mg/dL   BUN 18 6 - 20 mg/dL   Creatinine, Ser 1.61 0.61 - 1.24 mg/dL   Calcium 9.2 8.9 - 09.6 mg/dL   Total Protein 8.0 6.5 - 8.1 g/dL   Albumin 4.4 3.5 - 5.0 g/dL   AST 27 15 - 41 U/L   ALT 34 0 - 44 U/L   Alkaline Phosphatase 92 38 - 126 U/L   Total Bilirubin 0.5 0.3 - 1.2 mg/dL   GFR calc non Af Amer >60 >60 mL/min   GFR calc Af Amer >60 >60 mL/min   Anion gap 9 5 - 15    Comment: Performed at [facility], [address], [place], [place] [address]  Acetaminophen level     Status: Abnormal   Collection Time: 07/05/18 10:19 AM  Result Value Ref Range   Acetaminophen (Tylenol), Serum <10 (L) 10 - 30 ug/mL    Comment: (NOTE) Therapeutic concentrations vary significantly. A range of 10-30 ug/mL  may be an effective concentration for many patients. However, some  are best treated at concentrations outside of this range. Acetaminophen concentrations >150 ug/mL at 4 hours after ingestion  and >50 ug/mL at 12 hours after ingestion are often associated with  toxic reactions. Performed at [facility], [address] [place], [place] [address]   Ethanol     Status: None   Collection Time: 07/05/18 10:19 AM  Result Value Ref Range   Alcohol, Ethyl (B) <10 <10 mg/dL    Comment: (NOTE) Lowest detectable limit for serum alcohol is 10 mg/dL. For medical purposes only. Performed at [facility], [address] [place], [place] [address]   Salicylate level     Status: None   Collection Time: 07/05/18 10:19 AM  Result Value Ref Range   Salicylate Lvl <7.0 2.8 - 30.0 mg/dL    Comment: Performed at [facility], [address], [place], [place] [address]  CBC with Differential     Status: None  Collection Time: 07/05/18 10:19 AM  Result Value Ref Range   WBC 7.1 4.0 - 10.5 K/uL   RBC 5.58 4.22 - 5.81 MIL/uL   Hemoglobin 16.8 13.0 - 17.0 g/dL   HCT 16.1 09.6 - 04.5 %   MCV 87.6 80.0 - 100.0 fL   MCH 30.1 26.0 - 34.0 pg   MCHC 34.4 30.0 - 36.0 g/dL   RDW 40.9 81.1 - 91.4 %   Platelets 273 150 - 400 K/uL   nRBC 0.0 0.0 - 0.2 %   Neutrophils Relative % 62 %   Neutro Abs 4.3 1.7 - 7.7 K/uL   Lymphocytes Relative 28 %   Lymphs Abs 2.0 0.7 - 4.0 K/uL   Monocytes Relative 8 %   Monocytes Absolute 0.6 0.1 - 1.0 K/uL   Eosinophils Relative 2 %   Eosinophils Absolute 0.2 0.0 - 0.5 K/uL   Basophils Relative 0 %   Basophils Absolute 0.0 0.0 - 0.1 K/uL   Immature Granulocytes 0 %   Abs Immature Granulocytes 0.02 0.00 - 0.07 K/uL    Comment: Performed at [facility], [address] [place], [place] [address]  Magnesium     Status: None   Collection Time: 07/05/18  2:34 PM  Result Value Ref Range   Magnesium 2.0 1.7 - 2.4 mg/dL    Comment: Performed at [facility], [address] [place], [place] [address]  HIV antibody (Routine Testing)     Status: None   Collection Time: 07/05/18  2:34 PM  Result Value Ref Range   HIV Screen 4th Generation wRfx Non Reactive Non Reactive    Comment: (NOTE) Performed At: [facility] [address]  [place], [place] [id number] Jolene Schimke MD EX:[id number]   Urine Drug Screen, Qualitative ([facility] only)     Status: None   Collection Time: 07/05/18  5:15 PM  Result Value Ref Range   Tricyclic, Ur Screen NONE DETECTED NONE DETECTED   Amphetamines, Ur Screen NONE DETECTED NONE DETECTED   MDMA (Ecstasy)Ur Screen NONE DETECTED NONE DETECTED   Cocaine Metabolite,Ur [place] NONE DETECTED NONE DETECTED   Opiate, Ur Screen NONE DETECTED NONE DETECTED   Phencyclidine (PCP) Ur S NONE DETECTED NONE DETECTED   Cannabinoid 50 Ng, Ur  NONE DETECTED NONE DETECTED   Barbiturates, Ur Screen NONE DETECTED NONE DETECTED   Benzodiazepine, Ur Scrn NONE DETECTED NONE DETECTED   Methadone Scn, Ur NONE DETECTED NONE DETECTED    Comment: (NOTE) Tricyclics + metabolites, urine    Cutoff 1000 ng/mL Amphetamines + metabolites, urine  Cutoff 1000 ng/mL MDMA (Ecstasy), urine              Cutoff 500 ng/mL Cocaine Metabolite, urine          Cutoff 300 ng/mL Opiate + metabolites, urine        Cutoff 300 ng/mL Phencyclidine (PCP), urine         Cutoff 25 ng/mL Cannabinoid, urine                 Cutoff 50 ng/mL Barbiturates + metabolites, urine  Cutoff 200 ng/mL Benzodiazepine, urine              Cutoff 200 ng/mL Methadone, urine                   Cutoff 300 ng/mL The urine drug screen provides only a preliminary, unconfirmed analytical test result and should not be used for non-medical purposes. Clinical consideration and professional judgment should  be applied to any positive drug screen result due to possible interfering substances. A more specific alternate chemical method must be used in order to obtain a confirmed analytical result. Gas chromatography / mass spectrometry (GC/MS) is the preferred confirmat ory method. Performed at [facility], [address] [place], [place] [address]   Basic metabolic panel     Status: None   Collection Time: 07/06/18  5:12 AM  Result Value Ref Range   Sodium  142 135 - 145 mmol/L   Potassium 3.8 3.5 - 5.1 mmol/L   Chloride 108 98 - 111 mmol/L   CO2 28 22 - 32 mmol/L   Glucose, Bld 93 70 - 99 mg/dL   BUN 12 6 - 20 mg/dL   Creatinine, Ser 7.42 0.61 - 1.24 mg/dL   Calcium 8.9 8.9 - 59.5 mg/dL   GFR calc non Af Amer >60 >60 mL/min   GFR calc Af Amer >60 >60 mL/min   Anion gap 6 5 - 15    Comment: Performed at [facility], [address] [place], [place] [address]  CBC     Status: None   Collection Time: 07/06/18  5:12 AM  Result Value Ref Range   WBC 7.8 4.0 - 10.5 K/uL   RBC 5.56 4.22 - 5.81 MIL/uL   Hemoglobin 16.5 13.0 - 17.0 g/dL   HCT 64.3 32.9 - 51.8 %   MCV 89.6 80.0 - 100.0 fL   MCH 29.7 26.0 - 34.0 pg   MCHC 33.1 30.0 - 36.0 g/dL   RDW 84.1 66.0 - 63.0 %   Platelets 246 150 - 400 K/uL   nRBC 0.0 0.0 - 0.2 %    Comment: Performed at [facility], [address] [place], [place] [address]    Blood Alcohol level:  Lab Results  Component Value Date   [facility] <10 07/05/2018   ETH <10 02/16/2017    Metabolic Disorder Labs:  Lab Results  Component Value Date   HGBA1C 4.8 06/02/2018   MPG 91 06/02/2018   No results found for: PROLACTIN Lab Results  Component Value Date   CHOL 117 06/02/2018   TRIG 88 06/02/2018   HDL 33 (L) 06/02/2018   CHOLHDL 3.5 06/02/2018   VLDL 18 06/02/2018   LDLCALC 66 06/02/2018    Current Medications: Current Facility-Administered Medications  Medication Dose Route Frequency Provider Last Rate Last Dose  . acetaminophen (TYLENOL) tablet 650 mg  650 mg Oral Q6H PRN Clapacs, John T, MD      . alum & mag hydroxide-simeth (MAALOX/MYLANTA) 200-200-20 MG/5ML suspension 30 mL  30 mL Oral Q4H PRN Clapacs, John T, MD      . estradiol (ESTRACE) tablet 2 mg  2 mg Oral q1800 Clapacs, John T, MD      . hydrOXYzine (ATARAX/VISTARIL) tablet 50 mg  50 mg Oral TID PRN Clapacs, John T, MD      . magnesium hydroxide (MILK OF MAGNESIA) suspension 30 mL  30 mL Oral Daily PRN Clapacs, John T,  MD      . nicotine (NICODERM CQ - dosed in mg/24 hours) patch 21 mg  21 mg Transdermal Daily Clapacs, John T, MD      . QUEtiapine (SEROQUEL) tablet 100 mg  100 mg Oral QHS Clapacs, John T, MD      . spironolactone (ALDACTONE) tablet 12.5 mg  12.5 mg Oral q1800 Clapacs, John T, MD      . traZODone (DESYREL) tablet 100 mg  100 mg Oral QHS PRN Clapacs, Jackquline Denmark, MD      . Melene Muller ON 07/07/2018] venlafaxine XR (EFFEXOR-XR) 24 hr capsule 75 mg  75 mg Oral Q breakfast Clapacs, John T, MD       PTA Medications: Medications Prior to Admission  Medication Sig Dispense Refill Last Dose  . acetaminophen (TYLENOL) 325 MG tablet Take 2 tablets (650 mg total) by mouth every 6 (six) hours as needed for mild pain. (May buy over the counter) 1 tablet 0 prn at prn  . [START ON 07/07/2018] estradiol (ESTRACE) 2 MG tablet Take 1 tablet (2 mg total) by mouth daily. 30 tablet 0   . gabapentin (NEURONTIN) 300 MG capsule Take 1 capsule (300 mg total) by mouth 2 (two) times daily as needed (anxiety). 60 capsule 0 prn at prn  . nicotine polacrilex (NICORETTE) 4 MG gum Take 1 each (4 mg total) by mouth every 4 (four) hours as needed for smoking cessation. 100 tablet 0   . OXcarbazepine (TRILEPTAL) 150 MG tablet Take 1 tablet (150 mg total) by mouth 2 (two) times daily. For mood 60 tablet 0 07/04/2018 at 0800  . spironolactone (ALDACTONE) 25 MG tablet Take 0.5 tablets (12.5 mg total) by mouth daily. For gender transition 30 tablet 0 07/04/2018 at 0800  . traZODone (DESYREL) 50 MG tablet Take 1 tablet (50 mg total) by mouth at bedtime as needed and may repeat dose one time if needed for sleep. 30 tablet 0 prn at prn    Musculoskeletal: Strength & Muscle Tone: within normal limits Gait & Station: normal Patient leans: N/A  Psychiatric Specialty Exam: Physical Exam  Nursing note and vitals reviewed. Constitutional: She appears well-developed and well-nourished.  HENT:  Head: Normocephalic and atraumatic.  Eyes: Pupils are  equal, round, and reactive to light. Conjunctivae are normal.  Neck: Normal range of motion.  Cardiovascular: Regular rhythm and normal heart sounds.  Respiratory: Effort normal. No respiratory distress.  GI: Soft.  Musculoskeletal: Normal range of motion.  Neurological: She is alert.  Skin: Skin is warm and dry.  Psychiatric: Judgment normal. Her affect is blunt. Her speech is delayed. She is slowed. Thought content is not paranoid. Cognition and memory are normal. She expresses no homicidal and no suicidal ideation.    Review of Systems  Constitutional: Negative.   HENT: Negative.   Eyes: Negative.   Respiratory: Negative.   Cardiovascular: Negative.   Gastrointestinal: Negative.   Musculoskeletal: Negative.   Skin: Negative.   Neurological: Negative.   Psychiatric/Behavioral: Positive for depression. Negative for hallucinations, memory loss, substance abuse and suicidal ideas. The patient is not nervous/anxious and does not have insomnia.     Blood pressure 125/75, pulse 75, temperature 98.4 F (36.9 C), temperature source Oral, resp. rate 18, height  (1.854 m), weight 100.3 kg, SpO2 100 %.Body mass index is 29.17 kg/m.  General Appearance: Casual  Eye Contact:  Good  Speech:  Clear and Coherent and Patient has a known speech impediment but it does not get in the way of his being able to communicate normally  Volume:  Normal  Mood:  Dysphoric  Affect:  Congruent  Thought Process:  Goal Directed  Orientation:  Full (Time, Place, and Person)  Thought Content:  Logical  Suicidal Thoughts:  No  Homicidal Thoughts:  No  Memory:  Immediate;   Fair Recent;   Fair Remote;   Fair  Judgement:  Fair  Insight:  Fair  Psychomotor Activity:  Normal  Concentration:  Concentration: Fair  Recall:  [employer] of Knowledge:  Fair  Language:  Fair  Akathisia:  No  Handed:  Right  AIMS (if indicated):     Assets:  Desire for Improvement Housing Physical  Health Resilience Social Support  ADL's:  Intact  Cognition:  WNL  Sleep:       Treatment Plan Summary: Daily contact with patient to assess and evaluate symptoms and progress in treatment, Medication management and Plan 18 year old man with a history of recurrent severe depression and multiple suicide attempts.  Made a serious suicide attempt this time but then tells me that after a little while when he realized that he was not feeling like he was going to die he brought himself to the hospital.  Patient has a history of being cooperative with outpatient treatment but has some obstacles in that he currently has no insurance.  He lives in a chronically unsupportive environment at home.  Patient is currently pleasant and cooperative and open to appropriate treatment.  We discussed his medications and I suggest restarting Effexor and a modest dose of Seroquel for his mood symptoms impulsivity and possible bipolar depression.  Patient agrees to the plan.  Nicotine patch ordered.  Patient will be engaged in individual and group activities on the unit.  Likely length of stay somewhere around 2 to 3 days I would guess.  Observation Level/Precautions:  15 minute checks  Laboratory:  Chemistry Profile  Psychotherapy:    Medications:    Consultations:    Discharge Concerns:    Estimated LOS:  Other:     Physician Treatment Plan for Primary Diagnosis: Severe recurrent major depression without psychotic features (HCC) Long Term Goal(s): Improvement in symptoms so as ready for discharge  Short Term Goals: Ability to verbalize feelings will improve, Ability to disclose and discuss suicidal ideas and Ability to demonstrate self-control will improve  Physician Treatment Plan for Secondary Diagnosis: Principal Problem:   Severe recurrent major depression without psychotic features (HCC) Active Problems:   Overdose  Long Term Goal(s): Improvement in symptoms so as ready for discharge  Short Term  Goals: Ability to maintain clinical measurements within normal limits will improve and Compliance with prescribed medications will improve  I certify that inpatient services furnished can reasonably be expected to improve the patient's condition.    Mordecai Rasmussen, MD 3/20/20205:00 PM

## 2018-07-06 NOTE — Plan of Care (Signed)
  Problem: Activity: Goal: Risk for activity intolerance will decrease Outcome: Progressing   Problem: Nutrition: Goal: Adequate nutrition will be maintained Outcome: Progressing   Problem: Coping: Goal: Level of anxiety will decrease Outcome: Progressing   Problem: Pain Managment: Goal: General experience of comfort will improve Outcome: Progressing Note:  Complained of neck pain once- treated with tylenol with relief   Problem: Safety: Goal: Ability to remain free from injury will improve Outcome: Progressing   Problem: Skin Integrity: Goal: Risk for impaired skin integrity will decrease Outcome: Progressing   Problem: Nutrition: Goal: Adequate fluids and nutrition will be maintained Outcome: Progressing   Problem: Coping: Goal: Ability to disclose and discuss thoughts of suicide and self-harm will improve Outcome: Progressing   Problem: Education: Goal: Knowledge of General Education information will improve Description Including pain rating scale, medication(s)/side effects and non-pharmacologic comfort measures Outcome: Completed/Met

## 2018-07-06 NOTE — Progress Notes (Signed)
Discharged to [facility].  Transported by security.  Belongings went with him.

## 2018-07-07 NOTE — Plan of Care (Signed)
Patient is alert and oriented x 4. Patient was in bed upon this writers arrival to the unit. Declined to eat breakfast this morning, states, "I am not hungry". Compliant with medication administration. Reports that he slept good last night without the use of a sleep aid. Energy level is low with good concentration. Rates his depression, hopelessness and anxiety a 0. Reports that his most important goal for today is to give up nicotine and he plans on doing that by not using any nicotine products. Milieu remains safe with q 15 minute safety checks.

## 2018-07-07 NOTE — [facility] Group Notes (Signed)
LCSW Group Therapy Note   07/07/2018 1:15pm   Type of Therapy and Topic:  Group Therapy:  Trust and Honesty  Participation Level:  Did Not Attend  Description of Group:    In this group patients will be asked to explore the value of being honest.  Patients will be guided to discuss their thoughts, feelings, and behaviors related to honesty and trusting in others. Patients will process together how trust and honesty relate to forming relationships with peers, family members, and self. Each patient will be challenged to identify and express feelings of being vulnerable. Patients will discuss reasons why people are dishonest and identify alternative outcomes if one was truthful (to self or others). This group will be process-oriented, with patients participating in exploration of their own experiences, giving and receiving support, and processing challenge from other group members.   Therapeutic Goals: 1. Patient will identify why honesty is important to relationships and how honesty overall affects relationships.  2. Patient will identify a situation where they lied or were lied too and the  feelings, thought process, and behaviors surrounding the situation 3. Patient will identify the meaning of being vulnerable, how that feels, and how that correlates to being honest with self and others. 4. Patient will identify situations where they could have told the truth, but instead lied and explain reasons of dishonesty.   Summary of Patient Progress Pt was invited to attend group but chose not to attend. CSW will continue to encourage pt to attend group throughout their admission.     Therapeutic Modalities:   Cognitive Behavioral Therapy Solution Focused Therapy Motivational Interviewing Brief Therapy  Artice Bergerson  CUEBAS-COLON, LCSW 07/07/2018 12:38 PM

## 2018-07-07 NOTE — Plan of Care (Signed)
D: Patient has been in room all shift. Affect sad. Mood is depressed. Denies SI and contracts for safety. Did not come out of room for snack. Pt has some body odor and is disheveled. Medication compliant. Voices no complaints. A: Continue to monitor for safety and offer support. R: Safety maintained

## 2018-07-07 NOTE — Progress Notes (Signed)
Patient alert and oriented x 4, affect is flat and sad , he appears withdrawn , fonwards very little, denies SI/HI/AVH no distress  Noted.. patient appears not anxious, looks disheveled and poor grooming with body odor noted, he was encouraged to take a bat. Patient is receptive to staff, he took his evening  15 minutes safety checks maintained will continue to monitor.

## 2018-07-07 NOTE — Progress Notes (Signed)
[facility] MD Progress Note  07/07/2018 11:05 AM Brandon Cox  MRN:  [id number] Subjective:  Patient seen chart reviewed.  Labs reviewed.  This is a 18 year old transgender male to male patient, whose preferred pronounce is "she, her"  with a history of depression who was transferred to inpatient psych unit from the medical service yesterday, on 07/06/18, after recovering from an overdose on psych medications.   Today patient reports "doing okay", reports feeling "still down, tired". She reports feeling better compare to an admission day. She denies having any thoughts or urges to harm self or others. Denies feeling hopeless. Reports good sleep last night and feeling "sleepy" still. Anxiety is moderate. Reports regular appetite. Denies any manic symptoms, such as mood elevation, agitation, decreased need for sleep. Denies any hallucinations, does not express any delusions.  She denies any current physical complaints. She has been compliant with her inpatient psychiatric medications and denies any medication side effects. She reports no current questions or concerns.  Principal Problem: Severe recurrent major depression without psychotic features (HCC) Diagnosis: Principal Problem:   Severe recurrent major depression without psychotic features (HCC) Active Problems:   Overdose  Total Time spent with patient: 20 minutes  Past Psychiatric History: Depression    Past Medical History:  Past Medical History:  Diagnosis Date  . Depression   . Fracture, cervical vertebra (HCC) 10/22/2015   c6  . History of ITP    History reviewed. No pertinent surgical history. Family History:  Family History  Problem Relation Age of Onset  . Healthy Mother   . Drug abuse Cousin    Family Psychiatric  History: See HPI Social History:  Social History   Substance and Sexual Activity  Alcohol Use No  . Frequency: Never     Social History   Substance and Sexual Activity  Drug Use Yes  . Types:  Marijuana   Comment: used a couple of times in the last couple weeks    Social History   Socioeconomic History  . Marital status: Single    Spouse name: Not on file  . Number of children: 0  . Years of education: Not on file  . Highest education level: High school graduate  Occupational History  . Occupation: [employer]    Comment: part time  Social Needs  . Financial resource strain: Very hard  . Food insecurity:    Worry: Never true    Inability: Never true  . Transportation needs:    Medical: No    Non-medical: No  Tobacco Use  . Smoking status: Current Every Day Smoker    Packs/day: 1.00    Types: Cigarettes  . Smokeless tobacco: Current User    Types: Chew  . Tobacco comment: 1 pack per week  Substance and Sexual Activity  . Alcohol use: No    Frequency: Never  . Drug use: Yes    Types: Marijuana    Comment: used a couple of times in the last couple weeks  . Sexual activity: Not Currently  Lifestyle  . Physical activity:    Days per week: 0 days    Minutes per session: 0 min  . Stress: Very much  Relationships  . Social connections:    Talks on phone: Once a week    Gets together: More than three times a week    Attends religious service: Never    Active member of club or organization: No    Attends meetings of clubs or organizations: Never  Relationship status: Never married  Other Topics Concern  . Not on file  Social History Narrative  . Not on file   Additional Social History:                         Sleep: Good  Appetite:  Good  Current Medications: Current Facility-Administered Medications  Medication Dose Route Frequency Provider Last Rate Last Dose  . acetaminophen (TYLENOL) tablet 650 mg  650 mg Oral Q6H PRN Clapacs, John T, MD      . alum & mag hydroxide-simeth (MAALOX/MYLANTA) 200-200-20 MG/5ML suspension 30 mL  30 mL Oral Q4H PRN Clapacs, Jackquline DenmarkJohn T, MD      . estradiol (ESTRACE) tablet 2 mg  2 mg Oral q1800 Clapacs, Jackquline DenmarkJohn T, MD    2 mg at 07/06/18 1757  . hydrOXYzine (ATARAX/VISTARIL) tablet 50 mg  50 mg Oral TID PRN Clapacs, Jackquline DenmarkJohn T, MD      . magnesium hydroxide (MILK OF MAGNESIA) suspension 30 mL  30 mL Oral Daily PRN Clapacs, John T, MD      . nicotine (NICODERM CQ - dosed in mg/24 hours) patch 21 mg  21 mg Transdermal Daily Clapacs, John T, MD      . QUEtiapine (SEROQUEL) tablet 100 mg  100 mg Oral QHS Clapacs, John T, MD   100 mg at 07/06/18 2214  . spironolactone (ALDACTONE) tablet 12.5 mg  12.5 mg Oral q1800 Clapacs, John T, MD   12.5 mg at 07/06/18 1758  . traZODone (DESYREL) tablet 100 mg  100 mg Oral QHS PRN Clapacs, John T, MD      . venlafaxine XR (EFFEXOR-XR) 24 hr capsule 75 mg  75 mg Oral Q breakfast Clapacs, Jackquline DenmarkJohn T, MD   75 mg at 07/07/18 [id number]    Lab Results:  Results for orders placed or performed during the hospital encounter of 07/05/18 (from the past 48 hour(s))  Magnesium     Status: None   Collection Time: 07/05/18  2:34 PM  Result Value Ref Range   Magnesium 2.0 1.7 - 2.4 mg/dL    Comment: Performed at [facility], [address] [place], [place] [address]  HIV antibody (Routine Testing)     Status: None   Collection Time: 07/05/18  2:34 PM  Result Value Ref Range   HIV Screen 4th Generation wRfx Non Reactive Non Reactive    Comment: (NOTE) Performed At: [facility] [address] [place], [place] [id number] Jolene SchimkeNagendra Sanjai MD YQ:[id number]:[phone]   Urine Drug Screen, Qualitative ([facility] only)     Status: None   Collection Time: 07/05/18  5:15 PM  Result Value Ref Range   Tricyclic, Ur Screen NONE DETECTED NONE DETECTED   Amphetamines, Ur Screen NONE DETECTED NONE DETECTED   MDMA (Ecstasy)Ur Screen NONE DETECTED NONE DETECTED   Cocaine Metabolite,Ur [place] NONE DETECTED NONE DETECTED   Opiate, Ur Screen NONE DETECTED NONE DETECTED   Phencyclidine (PCP) Ur S NONE DETECTED NONE DETECTED   Cannabinoid 50 Ng, Ur [place] NONE DETECTED NONE DETECTED   Barbiturates, Ur Screen NONE  DETECTED NONE DETECTED   Benzodiazepine, Ur Scrn NONE DETECTED NONE DETECTED   Methadone Scn, Ur NONE DETECTED NONE DETECTED    Comment: (NOTE) Tricyclics + metabolites, urine    Cutoff 1000 ng/mL Amphetamines + metabolites, urine  Cutoff 1000 ng/mL MDMA (Ecstasy), urine              Cutoff 500 ng/mL Cocaine Metabolite, urine  Cutoff 300 ng/mL Opiate + metabolites, urine        Cutoff 300 ng/mL Phencyclidine (PCP), urine         Cutoff 25 ng/mL Cannabinoid, urine                 Cutoff 50 ng/mL Barbiturates + metabolites, urine  Cutoff 200 ng/mL Benzodiazepine, urine              Cutoff 200 ng/mL Methadone, urine                   Cutoff 300 ng/mL The urine drug screen provides only a preliminary, unconfirmed analytical test result and should not be used for non-medical purposes. Clinical consideration and professional judgment should be applied to any positive drug screen result due to possible interfering substances. A more specific alternate chemical method must be used in order to obtain a confirmed analytical result. Gas chromatography / mass spectrometry (GC/MS) is the preferred confirmat ory method. Performed at [facility], [address] [place], [place] [address]   Basic metabolic panel     Status: None   Collection Time: 07/06/18  5:12 AM  Result Value Ref Range   Sodium 142 135 - 145 mmol/L   Potassium 3.8 3.5 - 5.1 mmol/L   Chloride 108 98 - 111 mmol/L   CO2 28 22 - 32 mmol/L   Glucose, Bld 93 70 - 99 mg/dL   BUN 12 6 - 20 mg/dL   Creatinine, Ser 7.34 0.61 - 1.24 mg/dL   Calcium 8.9 8.9 - 19.3 mg/dL   GFR calc non Af Amer >60 >60 mL/min   GFR calc Af Amer >60 >60 mL/min   Anion gap 6 5 - 15    Comment: Performed at [facility], [address] [place], [place] [address]  CBC     Status: None   Collection Time: 07/06/18  5:12 AM  Result Value Ref Range   WBC 7.8 4.0 - 10.5 K/uL   RBC 5.56 4.22 - 5.81 MIL/uL   Hemoglobin 16.5  13.0 - 17.0 g/dL   HCT 09.7 35.3 - 29.9 %   MCV 89.6 80.0 - 100.0 fL   MCH 29.7 26.0 - 34.0 pg   MCHC 33.1 30.0 - 36.0 g/dL   RDW 24.2 68.3 - 41.9 %   Platelets 246 150 - 400 K/uL   nRBC 0.0 0.0 - 0.2 %    Comment: Performed at [facility] [address] [place], [place] [address]    Blood Alcohol level:  Lab Results  Component Value Date   [facility] <10 07/05/2018   ETH <10 02/16/2017    Metabolic Disorder Labs: Lab Results  Component Value Date   HGBA1C 4.8 06/02/2018   MPG 91 06/02/2018   No results found for: PROLACTIN Lab Results  Component Value Date   CHOL 117 06/02/2018   TRIG 88 06/02/2018   HDL 33 (L) 06/02/2018   CHOLHDL 3.5 06/02/2018   VLDL 18 06/02/2018   LDLCALC 66 06/02/2018    Physical Findings: AIMS:  , ,  ,  ,    CIWA:    COWS:     Musculoskeletal: Strength & Muscle Tone: within normal limits Gait & Station: normal Patient leans: N/A  Psychiatric Specialty Exam: Physical Exam  ROS  Blood pressure 125/78, pulse 87, temperature 97.6 F (36.4 C), temperature source Oral, resp. rate 18, height 6\' 1"  (1.854 m), weight 100.3 kg, SpO2 100 %.Body mass index is  29.17 kg/m.  General Appearance: Casual and Fairly Groomed  Eye Contact:  Good  Speech:  Normal Rate  Volume:  Decreased  Mood:  Depressed  Affect:  Constricted  Thought Process:  Coherent, Goal Directed and Linear  Orientation:  Full (Time, Place, and Person)  Thought Content:  Negative  Suicidal Thoughts:  No  Homicidal Thoughts:  No  Memory:  Immediate;   Good  Judgement:  Fair  Insight:  Good  Psychomotor Activity:  Decreased  Concentration:  Concentration: Good  Recall:  Good  Fund of Knowledge:  Fair  Language:  Good  Akathisia:  No  Handed:  Right  AIMS (if indicated):     Assets:  Desire for Improvement  ADL's:  Intact  Cognition:  WNL  Sleep:  Number of Hours: 8     Treatment Plan Summary: Daily contact with patient to assess and evaluate symptoms and  progress in treatment   -Continue current psych medications:    Effexor XR  PO QAM for depression;    Seroquel  PO QHS for depression, anxiety, sleep;    Trazodone  PO QHS PRN sleep;    Hydroxyziine  PO TID PRN anxiety. -Continue to encourage medication-compliance -Continue to monitor for med side effects.  -Encourage involvement on the unit.  -Dispo: to be determined at the beginning of next week.   Thalia Party, MD 07/07/2018, 11:05 AM

## 2018-07-07 NOTE — Progress Notes (Signed)
D: Patient has been in room all shift. Affect sad. Mood is depressed. Denies SI and contracts for safety. Did not come out of room for snack. Pt has some body odor and is disheveled. Medication compliant. Voices no complaints. A: Continue to monitor for safety and offer support. R: Safety maintained 

## 2018-07-07 NOTE — Plan of Care (Signed)
  Problem: Safety: Goal: Periods of time without injury will increase Outcome: Progressing  Patient is free of injury, he denies SI.

## 2018-07-08 MED ORDER — QUETIAPINE FUMARATE 25 MG PO TABS
50.0000 mg | ORAL_TABLET | Freq: Every day | ORAL | Status: DC
  Administered 2018-07-08: 50 mg via ORAL
  Filled 2018-07-08: qty 2

## 2018-07-08 MED ORDER — NICOTINE POLACRILEX 2 MG MT GUM
2.0000 mg | CHEWING_GUM | OROMUCOSAL | Status: DC | PRN
  Administered 2018-07-08 – 2018-07-09 (×4): 2 mg via ORAL
  Filled 2018-07-08 (×4): qty 1

## 2018-07-08 NOTE — Progress Notes (Signed)
D: Patient has been out socializing on the unit. Talking on the phone. Has showered, washed clothes and hair and hygiene is improved. Has been heard speaking with different accents. Denies SI, HI and AV hallucinations. Received Nicotine gum per prn order for nicotine craving. Voices no other complaints. A: Continue to monitor and offer support. R: Safety maintained.

## 2018-07-08 NOTE — Progress Notes (Addendum)
[facility] MD Progress Note  07/08/2018 10:22 AM Brandon Cox  MRN:  [id number] Subjective:  Patient seen, chart reviewed.    Patient is a 18 year old transgender male to male patient, whose preferred pronounce is "she, her"  with a history of depression who was transferred to inpatient psych unit from the medical service 2 days ago after overdose on psych medications.    Today patient was seen in the milieu and appears more active. Patient reports reports feeling "good". She identifies her mood as "good", denies feeling depressed. She denies having any thoughts or urges to harm self or others. Reports good sleep last night and regular appetite. Denies any hallucinations, does not express any delusions. She denies any current physical complaints. She has been compliant with her psych medications. She denies any medication side effects. She reports no current questions or concerns.  Principal Problem: Severe recurrent major depression without psychotic features (HCC) Diagnosis: Principal Problem:   Severe recurrent major depression without psychotic features (HCC) Active Problems:   Overdose  Total Time spent with patient: 15 minutes  Past Psychiatric History: see admission H&P  Past Medical History:  Past Medical History:  Diagnosis Date  . Depression   . Fracture, cervical vertebra (HCC) 10/22/2015   c6  . History of ITP    History reviewed. No pertinent surgical history. Family History:  Family History  Problem Relation Age of Onset  . Healthy Mother   . Drug abuse Cousin    Family Psychiatric  History: see admission H&P Social History:  Social History   Substance and Sexual Activity  Alcohol Use No  . Frequency: Never     Social History   Substance and Sexual Activity  Drug Use Yes  . Types: Marijuana   Comment: used a couple of times in the last couple weeks    Social History   Socioeconomic History  . Marital status: Single    Spouse name: Not on file  .  Number of children: 0  . Years of education: Not on file  . Highest education level: High school graduate  Occupational History  . Occupation: [employer]    Comment: part time  Social Needs  . Financial resource strain: Very hard  . Food insecurity:    Worry: Never true    Inability: Never true  . Transportation needs:    Medical: No    Non-medical: No  Tobacco Use  . Smoking status: Current Every Day Smoker    Packs/day: 1.00    Types: Cigarettes  . Smokeless tobacco: Current User    Types: Chew  . Tobacco comment: 1 pack per week  Substance and Sexual Activity  . Alcohol use: No    Frequency: Never  . Drug use: Yes    Types: Marijuana    Comment: used a couple of times in the last couple weeks  . Sexual activity: Not Currently  Lifestyle  . Physical activity:    Days per week: 0 days    Minutes per session: 0 min  . Stress: Very much  Relationships  . Social connections:    Talks on phone: Once a week    Gets together: More than three times a week    Attends religious service: Never    Active member of club or organization: No    Attends meetings of clubs or organizations: Never    Relationship status: Never married  Other Topics Concern  . Not on file  Social History Narrative  . Not  on file   Additional Social History:                         Sleep: Good  Appetite:  Good  Current Medications: Current Facility-Administered Medications  Medication Dose Route Frequency Provider Last Rate Last Dose  . acetaminophen (TYLENOL) tablet 650 mg  650 mg Oral Q6H PRN Clapacs, John T, MD      . alum & mag hydroxide-simeth (MAALOX/MYLANTA) 200-200-20 MG/5ML suspension 30 mL  30 mL Oral Q4H PRN Clapacs, John T, MD      . estradiol (ESTRACE) tablet 2 mg  2 mg Oral q1800 Clapacs, Jackquline Denmark, MD   2 mg at 07/07/18 1701  . hydrOXYzine (ATARAX/VISTARIL) tablet 50 mg  50 mg Oral TID PRN Clapacs, John T, MD      . magnesium hydroxide (MILK OF MAGNESIA) suspension 30 mL   30 mL Oral Daily PRN Clapacs, John T, MD      . nicotine (NICODERM CQ - dosed in mg/24 hours) patch 21 mg  21 mg Transdermal Daily Clapacs, John T, MD      . nicotine polacrilex (NICORETTE) gum 2 mg  2 mg Oral PRN Thalia Party, MD      . QUEtiapine (SEROQUEL) tablet 100 mg  100 mg Oral QHS Clapacs, John T, MD   100 mg at 07/07/18 2128  . spironolactone (ALDACTONE) tablet 12.5 mg  12.5 mg Oral q1800 Clapacs, John T, MD   12.5 mg at 07/07/18 1701  . traZODone (DESYREL) tablet 100 mg  100 mg Oral QHS PRN Clapacs, John T, MD      . venlafaxine XR (EFFEXOR-XR) 24 hr capsule 75 mg  75 mg Oral Q breakfast Clapacs, Jackquline Denmark, MD   75 mg at 07/08/18 0211    Lab Results: No results found for this or any previous visit (from the past 48 hour(s)).  Blood Alcohol level:  Lab Results  Component Value Date   ETH <10 07/05/2018   ETH <10 02/16/2017    Metabolic Disorder Labs: Lab Results  Component Value Date   HGBA1C 4.8 06/02/2018   MPG 91 06/02/2018   No results found for: PROLACTIN Lab Results  Component Value Date   CHOL 117 06/02/2018   TRIG 88 06/02/2018   HDL 33 (L) 06/02/2018   CHOLHDL 3.5 06/02/2018   VLDL 18 06/02/2018   LDLCALC 66 06/02/2018    Physical Findings: AIMS:  , ,  ,  ,    CIWA:    COWS:     Musculoskeletal: Strength & Muscle Tone: within normal limits Gait & Station: normal Patient leans: N/A  Psychiatric Specialty Exam: Physical Exam  ROS  Blood pressure (!) 130/59, pulse 67, temperature 97.6 F (36.4 C), temperature source Oral, resp. rate 18, height 6\' 1"  (1.854 m), weight 100.3 kg, SpO2 100 %.Body mass index is 29.17 kg/m.  General Appearance: Neat and Well Groomed  Eye Contact:  Good  Speech:  Normal Rate  Volume:  Normal  Mood:  Negative and Euthymic  Affect:  Appropriate, Congruent and Full Range  Thought Process:  Coherent, Goal Directed and Linear  Orientation:  Full (Time, Place, and Person)  Thought Content:  Logical  Suicidal Thoughts:  No   Homicidal Thoughts:  No  Memory:  Immediate;   Good Recent;   Good Remote;   Good  Judgement:  Good  Insight:  Good  Psychomotor Activity:  Normal  Concentration:  Concentration: Fair and Attention  Span: Fair  Recall:  [employer] of Knowledge:  Fair  Language:  Good  Akathisia:  No  Handed:  unknown  AIMS (if indicated):     Assets:  Desire for Improvement  ADL's:  Intact  Cognition:  WNL  Sleep:  Number of Hours: 8     Treatment Plan Summary: Patient is a 18 year old transgender male to male patient, whose preferred pronounce is "she, her"  with a history of depression who was transferred to inpatient psych unit from the medical service 2 days ago after overdose on psych medications.   Today patient reports mood improvement, denies any unsafe thoughts and is active in the milieu. No indication for med changes.  Impression: MDD, recurrent, severe, without psychotic features.  Plan: -continue inpatient psych admission; 15-min safety checks; daily contact with patient to assess and evaluate symptoms and progress in treatment   -Continue current psych medications:    Effexor XR 75mg  PO QAM for depression;    Seroquel 100mg  PO QHS for depression, anxiety, sleep;    Trazodone 100mg  PO QHS PRN sleep;    Hydroxyziine 50mg  PO TID PRN anxiety.  -Encourage involvement on the unit.  -Dispo: to be determined at the beginning of next week.  ADDENDUM 03:30pm: Patient reported leg restlessness he relates to Seroquel as he had restless legs on Seroquel in the past. We discuss that we will decrease the dose of Seroquel to 50mg  PO QHS tonight and will see if it will cause any changes.    Thalia Party, MD 07/08/2018, 10:22 AM

## 2018-07-08 NOTE — Plan of Care (Signed)
Patient present on the unit this am upon this writers arrival. Patient states, "I'm up early this morning and I've already showered." Patient observed in the dayroom interacting with select peers. Denies having any thoughts of wanting to harm himself or anyone else. Refused to take the nicotine patch this morning stating, "It makes my skin itch." MD ordered nicotine gum for patient to assist with cravings. Patient attended group therapy session this morning with appropriate interaction. Milieu remains safe with q 15 minute safety checks.

## 2018-07-08 NOTE — Plan of Care (Signed)
D: Patient has been out socializing on the unit. Talking on the phone. Has showered, washed clothes and hair and hygiene is improved. Has been heard speaking with different accents. Denies SI, HI and AV hallucinations. Received Nicotine gum per prn order for nicotine craving. Voices no other complaints. A: Continue to monitor and offer support. R: Safety maintained. 

## 2018-07-08 NOTE — [facility] Suicide Risk Assessment (Signed)
[facility] INPATIENT:  Family/Significant Other Suicide Prevention Education  Suicide Prevention Education:  Patient Refusal for Family/Significant Other Suicide Prevention Education: The patient Brandon Cox has refused to provide written consent for family/significant other to be provided Family/Significant Other Suicide Prevention Education during admission and/or prior to discharge.  Physician notified.  Rickeya Manus  CUEBAS-COLON 07/08/2018, 11:02 AM
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
## 2018-07-08 NOTE — BHH Group Notes (Signed)
LCSW Group Therapy Note 07/08/2018 1:15pm  Type of Therapy and Topic: Group Therapy: Feelings Around Returning Home & Establishing a Supportive Framework and Supporting Oneself When Supports Not Available  Participation Level: Active  Description of Group:  Patients first processed thoughts and feelings about upcoming discharge. These included fears of upcoming changes, lack of change, new living environments, judgements and expectations from others and overall stigma of mental health issues. The group then discussed the definition of a supportive framework, what that looks and feels like, and how do to discern it from an unhealthy non-supportive network. The group identified different types of supports as well as what to do when your family/friends are less than helpful or unavailable  Therapeutic Goals  1. Patient will identify one healthy supportive network that they can use at discharge. 2. Patient will identify one factor of a supportive framework and how to tell it from an unhealthy network. 3. Patient able to identify one coping skill to use when they do not have positive supports from others. 4. Patient will demonstrate ability to communicate their needs through discussion and/or role plays.  Summary of Patient Progress:  The patient reported she feels "ready to get out of here." Pt engaged during group session. As patients processed their anxiety about discharge and described healthy supports patient shared she is ready to be discharge. She stated "I am doing a lot better." Patients identified at least one self-care tool they were willing to use after discharge.   Therapeutic Modalities Cognitive Behavioral Therapy Motivational Interviewing   Severina Sykora  CUEBAS-COLON, LCSW 07/08/2018 8:18 AM

## 2018-07-09 MED ORDER — NICOTINE POLACRILEX 2 MG MT GUM
2.0000 mg | CHEWING_GUM | OROMUCOSAL | Status: DC | PRN
  Administered 2018-07-09: 2 mg via ORAL
  Filled 2018-07-09: qty 1

## 2018-07-09 MED ORDER — VENLAFAXINE HCL ER 75 MG PO CP24: 75.0000 mg | ORAL_CAPSULE | Freq: Every day | ORAL | 1 refills | Status: DC

## 2018-07-09 MED ORDER — SPIRONOLACTONE 25 MG PO TABS: 12.5000 mg | ORAL_TABLET | Freq: Every day | ORAL | 1 refills | Status: DC

## 2018-07-09 MED ORDER — QUETIAPINE FUMARATE 50 MG PO TABS: 50.0000 mg | ORAL_TABLET | Freq: Every day | ORAL | 1 refills | Status: DC

## 2018-07-09 MED ORDER — TRAZODONE HCL 100 MG PO TABS: 100.0000 mg | ORAL_TABLET | Freq: Every evening | ORAL | 1 refills | Status: DC | PRN

## 2018-07-09 NOTE — BHH Suicide Risk Assessment (Signed)
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   HENT: Negative.   Eyes: Negative.   Respiratory: Negative.   Cardiovascular: Negative.   Gastrointestinal: Negative.   Musculoskeletal: Negative.   Skin: Negative.   Neurological: Negative.   Psychiatric/Behavioral: Negative.     Blood pressure 137/84, pulse 66, temperature 97.6 F (36.4 C), temperature source Oral, resp. rate 18, height 6\' 1"  (1.854 m), weight 100.3 kg, SpO2 100 %.Body mass index is 29.17 kg/m.  General Appearance: Fairly Groomed  Patent attorney::  Good  Speech:  Clear and Coherent409  Volume:  Normal  Mood:  Euthymic  Affect:  Congruent  Thought Process:  Coherent  Orientation:  Full (Time, Place, and Person)  Thought Content:  Logical  Suicidal Thoughts:  No  Homicidal Thoughts:  No  Memory:  Immediate;   Fair Recent;   Fair Remote;   Fair  Judgement:  Fair  Insight:  Fair  Psychomotor Activity:  Normal  Concentration:  Fair  Recall:  [employer] of Knowledge:Fair  Language: Fair  Akathisia:  No  Handed:  Right  AIMS (if indicated):     Assets:  Desire for Improvement Housing Physical Health Resilience Social Support  Sleep:  Number of Hours: 7  Cognition: WNL  ADL's:  Intact   Mental Status Per Nursing Assessment::   On Admission:  Suicidal ideation indicated by patient  Demographic Factors:  Male, Adolescent or young adult and Gay, lesbian, or bisexual orientation  Loss Factors: Financial problems/change in socioeconomic status  Historical  Factors: Prior suicide attempts and Impulsivity  Risk Reduction Factors:   Positive social support and Positive therapeutic relationship  Continued Clinical Symptoms:  Depression:   Impulsivity  Cognitive Features That Contribute To Risk:  Polarized thinking    Suicide Risk:  Minimal: No identifiable suicidal ideation.  Patients presenting with no risk factors but with morbid ruminations; may be classified as minimal risk based on the severity of the depressive symptoms  Follow-up Information    [facility] Follow up.   Why:  Please follow up with [facility] on Monday, Wednesday or Friday between 8am-3pm for an assessment.  Please bring your discharge paperwork from this hospital visit, ID, and insurance information with you to your appointment. Thank you Contact information: [address] [place] [address] [phone]           Plan Of Care/Follow-up recommendations:  Activity:  Activity as tolerated Diet:  Regular diet Other:  Follow-up with outpatient treatment at [facility] continue current medicine.  Mordecai Rasmussen, MD 07/09/2018, 12:59 PM

## 2018-07-09 NOTE — Progress Notes (Signed)
  [facility] Adult Case Management Discharge Plan :  Will you be returning to the same living situation after discharge:  Yes,  pt lives with parents At discharge, do you have transportation home?: Yes,  pt car is on campus Do you have the ability to pay for your medications: Yes,  insurance  Release of information consent forms completed and in the chart;  Patient's signature needed at discharge.  Patient to Follow up at: Follow-up Information    [facility] Follow up.   Why:  Please follow up with [facility] on Monday, Wednesday or Friday between 8am-3pm for an assessment.  Please bring your discharge paperwork from this hospital visit, ID, and insurance information with you to your appointment. Thank you Contact information: [address] [place] [address] [phone]           Next level of care provider has access to [facility] Link:no  Safety Planning and Suicide Prevention discussed: Yes,  with pt; pt declined family contact     Has patient been referred to the Quitline?: N/A patient is not a smoker  Patient has been referred for addiction treatment: N/A  Suzan Slick, LCSW 07/09/2018, 1:04 PM

## 2018-07-09 NOTE — Progress Notes (Signed)
Recreation Therapy Notes  Date: 07/09/2018  Time: 9:30 am  Location: Craft Room  Behavioral response: Appropriate  Intervention Topic: Goals  Discussion/Intervention:  Group content on today was focused on goals. Patients described what goals are and how they define goals. Individuals expressed how they go about setting goals and reaching them. The group identified how important goals are and if they make short term goals to reach long term goals. Patients described how many goals they work on at a time and what affects them not reaching their goal. Individuals described how much time they put into planning and obtaining their goals. The group participated in the intervention "My Goal Board" and made personal goal boards to help them achieve their goal. Clinical Observations/Feedback:  Patient came to group and explained that SMART goals must be Specific, Measurable, Attainable, Realistic and Time bound. She explained that SMART goals are important to make sure things get done. Individual was social with peers and staff while participating in group.  Brandon Cox LRT/CTRS         Brandon Cox 07/09/2018 11:02 AM

## 2018-07-09 NOTE — BHH Counselor (Signed)
CSW provided pt with shelter resource list, per pt request.

## 2018-07-09 NOTE — Plan of Care (Signed)
Patient is ambulatory on the unit. Affect is brighter and is currently denying having any thoughts of SI/HI and AVH. Complained of upper jaw pain on the left side this morning. PRN Tylenol administered with effectiveness. Reports that he slept last night without the use of an sleep aid. His goal for today is to talk to the doctor. Milieu remains safe with q 15 minute safety checks. Will continue to monitor.

## 2018-07-09 NOTE — BHH Group Notes (Signed)
LCSW Group Therapy Note   07/09/2018 1:00 PM  Type of Therapy and Topic:  Group Therapy:  Overcoming Obstacles   Participation Level:  Active   Description of Group:    In this group patients will be encouraged to explore what they see as obstacles to their own wellness and recovery. They will be guided to discuss their thoughts, feelings, and behaviors related to these obstacles. The group will process together ways to cope with barriers, with attention given to specific choices patients can make. Each patient will be challenged to identify changes they are motivated to make in order to overcome their obstacles. This group will be process-oriented, with patients participating in exploration of their own experiences as well as giving and receiving support and challenge from other group members.   Therapeutic Goals: 1. Patient will identify personal and current obstacles as they relate to admission. 2. Patient will identify barriers that currently interfere with their wellness or overcoming obstacles.  3. Patient will identify feelings, thought process and behaviors related to these barriers. 4. Patient will identify two changes they are willing to make to overcome these obstacles:      Summary of Patient Progress Patient was present and an active participant in group. Patient engaged in discussion on obstacles.  Patient identified the following obstacles: "doctor putting in orders and her parents".  Patient reports feelings of anger surrounding his obstacles.  Patient was able to identify ways to address his obstacles.     Therapeutic Modalities:   Cognitive Behavioral Therapy Solution Focused Therapy Motivational Interviewing Relapse Prevention Therapy  Penni Homans, MSW, LCSW 07/09/2018 12:42 PM

## 2018-07-09 NOTE — Discharge Summary (Signed)
Physician Discharge Summary Note  Patient:  Brandon Cox is an 18 y.o., adult MRN:  [id number] DOB:  [birth year] Patient phone:  [phone] (home)  Patient address:   [address] [place] [address],  Total Time spent with patient: 45 minutes  Date of Admission:  07/06/2018 Date of Discharge: July 09, 2018  Reason for Admission: Admitted in transfer from the medical service because of suicide attempt.  Ongoing depression anxiety mood instability.  Principal Problem: Severe recurrent major depression without psychotic features [facility]) Discharge Diagnoses: Principal Problem:   Severe recurrent major depression without psychotic features (HCC) Active Problems:   Overdose   Past Psychiatric History: Past history of prior suicide attempts chronic depression  Past Medical History:  Past Medical History:  Diagnosis Date  . Depression   . Fracture, cervical vertebra (HCC) 10/22/2015   c6  . History of ITP    History reviewed. No pertinent surgical history. Family History:  Family History  Problem Relation Age of Onset  . Healthy Mother   . Drug abuse Cousin    Family Psychiatric  History: None Social History:  Social History   Substance and Sexual Activity  Alcohol Use No  . Frequency: Never     Social History   Substance and Sexual Activity  Drug Use Yes  . Types: Marijuana   Comment: used a couple of times in the last couple weeks    Social History   Socioeconomic History  . Marital status: Single    Spouse name: Not on file  . Number of children: 0  . Years of education: Not on file  . Highest education level: High school graduate  Occupational History  . Occupation: [employer]    Comment: part time  Social Needs  . Financial resource strain: Very hard  . Food insecurity:    Worry: Never true    Inability: Never true  . Transportation needs:    Medical: No    Non-medical: No  Tobacco Use  . Smoking status: Current Every Day Smoker   Packs/day: 1.00    Types: Cigarettes  . Smokeless tobacco: Current User    Types: Chew  . Tobacco comment: 1 pack per week  Substance and Sexual Activity  . Alcohol use: No    Frequency: Never  . Drug use: Yes    Types: Marijuana    Comment: used a couple of times in the last couple weeks  . Sexual activity: Not Currently  Lifestyle  . Physical activity:    Days per week: 0 days    Minutes per session: 0 min  . Stress: Very much  Relationships  . Social connections:    Talks on phone: Once a week    Gets together: More than three times a week    Attends religious service: Never    Active member of club or organization: No    Attends meetings of clubs or organizations: Never    Relationship status: Never married  Other Topics Concern  . Not on file  Social History Narrative  . Not on file    Hospital Course: In the hospital on the psychiatry service the patient was appropriate and showed no dangerous behaviors.  She was cooperative and appeared eager for improvement.  Tolerated antidepressant medication.  He engaged appropriately in groups and activities and social activity.  At the time of discharge her affect is bright upbeat euthymic no sign of dangerousness.  Denies suicidal or homicidal ideation.  Shows good  insight into the problems with chronic suicidality.  Tolerating medicine well.  Patient will be given prescriptions and [facility].  Feels comfortable that she is going back home even though her family is there.  Feels her coping skills have improved.  Physical Findings: AIMS:  , ,  ,  ,    CIWA:    COWS:     Musculoskeletal: Strength & Muscle Tone: within normal limits Gait & Station: normal Patient leans: N/A  Psychiatric Specialty Exam: Physical Exam  Nursing note and vitals reviewed. Constitutional: She appears well-developed and well-nourished.  HENT:  Head: Normocephalic and atraumatic.  Eyes: Pupils are equal, round, and reactive to  light. Conjunctivae are normal.  Neck: Normal range of motion.  Cardiovascular: Regular rhythm and normal heart sounds.  Respiratory: Effort normal.  GI: Soft.  Musculoskeletal: Normal range of motion.  Neurological: She is alert.  Skin: Skin is warm and dry.  Psychiatric: She has a normal mood and affect. Her speech is normal and behavior is normal. Judgment and thought content normal. Cognition and memory are normal.    Review of Systems  Constitutional: Negative.   HENT: Negative.   Eyes: Negative.   Respiratory: Negative.   Cardiovascular: Negative.   Gastrointestinal: Negative.   Musculoskeletal: Negative.   Skin: Negative.   Neurological: Negative.   Psychiatric/Behavioral: Negative.     Blood pressure 137/84, pulse 66, temperature 97.6 F (36.4 C), temperature source Oral, resp. rate 18, height 6\' 1"  (1.854 m), weight 100.3 kg, SpO2 100 %.Body mass index is 29.17 kg/m.  General Appearance: Fairly Groomed  Eye Contact:  Good  Speech:  Clear and Coherent  Volume:  Normal  Mood:  Euthymic  Affect:  Congruent  Thought Process:  Goal Directed  Orientation:  Full (Time, Place, and Person)  Thought Content:  Logical  Suicidal Thoughts:  No  Homicidal Thoughts:  No  Memory:  Immediate;   Fair Recent;   Fair Remote;   Fair  Judgement:  Fair  Insight:  Fair  Psychomotor Activity:  Decreased  Concentration:  Concentration: Fair  Recall:  Fair  Fund of Knowledge:  Fair  Language:  Fair  Akathisia:  No  Handed:  Right  AIMS (if indicated):     Assets:  Desire for Improvement Housing Physical Health Resilience  ADL's:  Intact  Cognition:  WNL  Sleep:  Number of Hours: 7        Has this patient used any form of tobacco in the last 30 days? (Cigarettes, Smokeless Tobacco, Cigars, and/or Pipes) Yes, Yes, A prescription for an FDA-approved tobacco cessation medication was offered at discharge and the patient refused  Blood Alcohol level:  Lab Results  Component  Value Date   [facility]10 07/05/2018   ETH <10 02/16/2017    Metabolic Disorder Labs:  Lab Results  Component Value Date   HGBA1C 4.8 06/02/2018   MPG 91 06/02/2018   No results found for: PROLACTIN Lab Results  Component Value Date   CHOL 117 06/02/2018   TRIG 88 06/02/2018   HDL 33 (L) 06/02/2018   CHOLHDL 3.5 06/02/2018   VLDL 18 06/02/2018   LDLCALC 66 06/02/2018    See Psychiatric Specialty Exam and Suicide Risk Assessment completed by Attending Physician prior to discharge.  Discharge destination:  Home  Is patient on multiple antipsychotic therapies at discharge:  No   Has Patient had three or more failed trials of antipsychotic monotherapy by history:  No  Recommended Plan  for Multiple Antipsychotic Therapies: NA  Discharge Instructions    Diet - low sodium heart healthy   Complete by:  As directed    Increase activity slowly   Complete by:  As directed      Allergies as of 07/09/2018   No Known Allergies     Medication List    STOP taking these medications   acetaminophen 325 MG tablet Commonly known as:  TYLENOL   gabapentin 300 MG capsule Commonly known as:  NEURONTIN   nicotine polacrilex 4 MG gum Commonly known as:  NICORETTE   OXcarbazepine 150 MG tablet Commonly known as:  TRILEPTAL     TAKE these medications     Indication  estradiol 2 MG tablet Commonly known as:  ESTRACE Take 1 tablet (2 mg total) by mouth daily.  Indication:  Gender transition   QUEtiapine 50 MG tablet Commonly known as:  SEROQUEL Take 1 tablet (50 mg total) by mouth at bedtime.  Indication:  Depressive Phase of Manic-Depression   spironolactone 25 MG tablet Commonly known as:  ALDACTONE Take 0.5 tablets (12.5 mg total) by mouth daily at 6 PM. What changed:    when to take this  additional instructions  Indication:  Person Born Male, Identifies as Male   traZODone 100 MG tablet Commonly known as:  DESYREL Take 1 tablet (100 mg total) by mouth at bedtime  as needed for sleep. What changed:    medication strength  how much to take  when to take this  Indication:  Trouble Sleeping   venlafaxine XR 75 MG 24 hr capsule Commonly known as:  EFFEXOR-XR Take 1 capsule (75 mg total) by mouth daily with breakfast. Start taking on:  July 10, 2018  Indication:  Major Depressive Disorder      Follow-up Information    [facility] Follow up.   Why:  Please follow up with [facility] on Monday, Wednesday or Friday between 8am-3pm for an assessment.  Please bring your discharge paperwork from this hospital visit, ID, and insurance information with you to your appointment. Thank you Contact information: [address] [place] [address] [phone]           Follow-up recommendations:  Activity:  Activity as tolerated Diet:  Regular diet Other:  Follow-up with outpatient treatment at [facility]  Comments: Patient agrees to plan.  Case reviewed with treatment team.  Prescriptions prepared.  Did not write a new prescription for the estradiol but everything else is refilled or given prescriptions for a month with 1 additional refill.  Signed: Mordecai Rasmussen, MD 07/09/2018, 1:03 PM

## 2018-07-09 NOTE — Progress Notes (Signed)
Patient alert and oriented x 4. Ambulates unit with steady gait. Verbally denies SI/HI/AVH and pain. Patient discharged on above date and time. Verbalized understanding the discharge information provided to patient upon discharge. Patient departed unit with discharge paperwork, prescriptions and personal belongings. Patient's car is on campus, he transported himself home to his parents house and plans to follow up with [facility] on Monday. No distress noted.

## 2020-10-04 ENCOUNTER — Ambulatory Visit (HOSPITAL_COMMUNITY)
Admission: AD | Admit: 2020-10-04 | Discharge: 2020-10-04 | Disposition: A | Discharge status: Home / Self Care | Payer: 59 | Attending: Psychiatry | Admitting: Psychiatry
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
## 2020-10-04 NOTE — H&P (Signed)
Behavioral Health Medical Screening Exam  Brandon Cox is an 20 y.o. adult (male/male) who prefers male pronouns presents to [facility] for assessment. Patient reports documented history of Borderline Personality Disorder; states they saw their provider this past week who they says recommended inpatient hospitalization due to his "severe borderline personality symptoms". Patient reports increased social stressors involving their current relationship and financial issues. They endorse childhood trauma and abandonment issues in which they feel is affecting their current relationship. No current outpatient therapist noted; states they currently see Dr Georgeann Oppenheim who prescribes Xanax 2 mg, Adderall 15 mg, Fluoxetine 20 mg, and Quetiapine Fumarate 25 mg; last appointment was "within the past two weeks". Per PDMP review, there is no history of any controlled substance prescriptions.   Patient endorses inconsistent sleep, anhedonia, feelings of guilt and worthlessness, inconsistent levels of energy and concentration, stable appetite; denies any psychomotor changes and suicidal ideations. They deny any suicidal or homicidal ideations, auditory or visual hallucinations, and does not appear to be actively psychotic or responding to any external/internal stimuli at this time. Patient states they have been having food insecurity issues due to financial problems; inquired about possible resources. Provider discussed the effects of childhood trauma and tools such as therapy; discussed dialectical behavior therapy treatment to address BPD as well as inpatient hospitalization criteria and that they did not currently meet criteria. They expressed interest in outpatient therapy, pantry resources, and LGBT resources.   Upon discharge patient asked LCSW Sharyl Nimrod if there was any way you could get "Xanax" stating their primary provider wouldn't be able to write an order until tomorrow. Upon review of the PDMP patient has no  history of Xanax or Adderall as they stated on his paperwork. Provider reassessed patient due to their new request and patient then stated they hadn't seen his provider in "a few months" instead of "two weeks" as he stated during assessment. They continue to deny any suicidal or homicidal ideations, auditory or visual hallucinations, and do not appear to be actively psychotic or responding to any external/internal stimuli at this time. Patient appears to be inconsistent historian and secondary gain cannot be ruled out.  Patient was discharged home with requested resources.   Total Time spent with patient: 20 minutes  Psychiatric Specialty Exam: Physical Exam Vitals reviewed.  Constitutional:      Appearance: She is normal weight.  HENT:     Head: Normocephalic.     Nose: Nose normal.     Mouth/Throat:     Mouth: Mucous membranes are moist.     Pharynx: Oropharynx is clear.  Cardiovascular:     Rate and Rhythm: Normal rate.  Pulmonary:     Effort: Pulmonary effort is normal.  Musculoskeletal:        General: Normal range of motion.     Cervical back: Normal range of motion.  Neurological:     General: No focal deficit present.     Mental Status: She is alert and oriented to person, place, and time.  Psychiatric:        Attention and Perception: Attention and perception normal.        Mood and Affect: Mood and affect normal.        Speech: Speech normal.        Behavior: Behavior normal. Behavior is cooperative.        Thought Content: Thought content normal.        Cognition and Memory: Cognition and memory normal.  Judgment: Judgment normal.   Review of Systems  Constitutional:  Negative for activity change, appetite change, chills, diaphoresis, fatigue, fever and unexpected weight change.  Respiratory: Negative.    Cardiovascular: Negative.   Neurological: Negative.   Psychiatric/Behavioral:  Positive for sleep disturbance. Negative for agitation, hallucinations and  suicidal ideas.   All other systems reviewed and are negative. Blood pressure 132/78, pulse 69, temperature 97.9 F (36.6 C), temperature source Oral, resp. rate 20.There is no height or weight on file to calculate BMI. General Appearance: Casual Eye Contact:  Good Speech:   speech impediment Volume:  Normal Mood:  Euthymic Affect:  Congruent Thought Process:  Coherent and Goal Directed Orientation:  Full (Time, Place, and Person) Thought Content:  WDL and Logical Suicidal Thoughts:  No Homicidal Thoughts:  No Memory:  Immediate;   Good Recent;   Fair Remote;   Good Judgement:  Intact Insight:  Present Psychomotor Activity:  Normal Concentration: Concentration: Good and Attention Span: Good Recall:  Good Fund of Knowledge:Fair Language: Good Akathisia:  NA Handed:   AIMS (if indicated):    Assets:  Communication Skills Desire for Improvement Financial Resources/Insurance Housing Intimacy Leisure Time Physical Health Resilience Social Support Vocational/Educational Sleep:     Musculoskeletal: Strength & Muscle Tone: within normal limits Gait & Station: normal Patient leans: N/A  Blood pressure 132/78, pulse 69, temperature 97.9 F (36.6 C), temperature source Oral, resp. rate 20.  Recommendations: Based on my evaluation the patient does not appear to have an emergency medical condition. Patient provided resources for outpatient therapy, local LGBTQ support groups, and local food resources/pantries.   Loletta Parish, NP 10/04/2020, 1:45 PM

## 2021-03-25 DIAGNOSIS — Z3009 Encounter for other general counseling and advice on contraception: Secondary | ICD-10-CM | POA: Diagnosis not present

## 2021-03-25 DIAGNOSIS — Z0389 Encounter for observation for other suspected diseases and conditions ruled out: Secondary | ICD-10-CM | POA: Diagnosis not present

## 2021-03-25 DIAGNOSIS — Z1388 Encounter for screening for disorder due to exposure to contaminants: Secondary | ICD-10-CM | POA: Diagnosis not present

## 2022-03-18 DIAGNOSIS — M7989 Other specified soft tissue disorders: Secondary | ICD-10-CM | POA: Diagnosis not present

## 2022-03-18 DIAGNOSIS — S90412A Abrasion, left great toe, initial encounter: Secondary | ICD-10-CM | POA: Diagnosis not present

## 2022-03-18 DIAGNOSIS — J069 Acute upper respiratory infection, unspecified: Secondary | ICD-10-CM | POA: Diagnosis not present

## 2022-03-18 DIAGNOSIS — Z419 Encounter for procedure for purposes other than remedying health state, unspecified: Secondary | ICD-10-CM | POA: Diagnosis not present

## 2022-03-18 DIAGNOSIS — S92405B Nondisplaced unspecified fracture of left great toe, initial encounter for open fracture: Secondary | ICD-10-CM | POA: Diagnosis not present

## 2022-03-18 DIAGNOSIS — S92425A Nondisplaced fracture of distal phalanx of left great toe, initial encounter for closed fracture: Secondary | ICD-10-CM | POA: Diagnosis not present

## 2022-03-18 DIAGNOSIS — S92405A Nondisplaced unspecified fracture of left great toe, initial encounter for closed fracture: Secondary | ICD-10-CM | POA: Diagnosis not present

## 2022-03-28 DIAGNOSIS — M79675 Pain in left toe(s): Secondary | ICD-10-CM | POA: Diagnosis not present

## 2022-03-28 DIAGNOSIS — S92402A Displaced unspecified fracture of left great toe, initial encounter for closed fracture: Secondary | ICD-10-CM | POA: Diagnosis not present

## 2022-03-28 DIAGNOSIS — S90412A Abrasion, left great toe, initial encounter: Secondary | ICD-10-CM | POA: Diagnosis not present

## 2022-04-09 DIAGNOSIS — J1089 Influenza due to other identified influenza virus with other manifestations: Secondary | ICD-10-CM | POA: Diagnosis not present

## 2022-04-09 DIAGNOSIS — R051 Acute cough: Secondary | ICD-10-CM | POA: Diagnosis not present

## 2022-04-09 DIAGNOSIS — R1110 Vomiting, unspecified: Secondary | ICD-10-CM | POA: Diagnosis not present

## 2022-04-15 DIAGNOSIS — F1223 Cannabis dependence with withdrawal: Secondary | ICD-10-CM | POA: Diagnosis not present

## 2022-04-15 DIAGNOSIS — F603 Borderline personality disorder: Secondary | ICD-10-CM | POA: Diagnosis not present

## 2022-04-18 DIAGNOSIS — Z419 Encounter for procedure for purposes other than remedying health state, unspecified: Secondary | ICD-10-CM | POA: Diagnosis not present

## 2022-04-28 DIAGNOSIS — S92402D Displaced unspecified fracture of left great toe, subsequent encounter for fracture with routine healing: Secondary | ICD-10-CM | POA: Diagnosis not present

## 2022-04-28 DIAGNOSIS — M79675 Pain in left toe(s): Secondary | ICD-10-CM | POA: Diagnosis not present

## 2022-05-19 DIAGNOSIS — Z419 Encounter for procedure for purposes other than remedying health state, unspecified: Secondary | ICD-10-CM | POA: Diagnosis not present

## 2022-06-17 DIAGNOSIS — Z419 Encounter for procedure for purposes other than remedying health state, unspecified: Secondary | ICD-10-CM | POA: Diagnosis not present

## 2022-07-18 DIAGNOSIS — Z419 Encounter for procedure for purposes other than remedying health state, unspecified: Secondary | ICD-10-CM | POA: Diagnosis not present

## 2022-08-17 DIAGNOSIS — Z419 Encounter for procedure for purposes other than remedying health state, unspecified: Secondary | ICD-10-CM | POA: Diagnosis not present

## 2022-08-29 ENCOUNTER — Ambulatory Visit (INDEPENDENT_AMBULATORY_CARE_PROVIDER_SITE_OTHER): Payer: Self-pay | Admitting: Internal Medicine

## 2022-08-29 ENCOUNTER — Encounter: Payer: Self-pay | Admitting: Internal Medicine

## 2022-08-29 VITALS — BP 116/80 | HR 119 | Resp 18 | Ht 73.0 in | Wt 223.1 lb

## 2022-08-29 DIAGNOSIS — F332 Major depressive disorder, recurrent severe without psychotic features: Secondary | ICD-10-CM

## 2022-08-29 DIAGNOSIS — G4700 Insomnia, unspecified: Secondary | ICD-10-CM

## 2022-08-29 MED ORDER — QUETIAPINE FUMARATE 25 MG PO TABS: 25.0000 mg | ORAL_TABLET | Freq: Every day | ORAL | 1 refills | Status: AC

## 2022-08-29 MED ORDER — VENLAFAXINE HCL ER 37.5 MG PO CP24: 37.5000 mg | ORAL_CAPSULE | Freq: Every day | ORAL | 1 refills | Status: AC

## 2022-08-29 MED ORDER — TRAZODONE HCL 50 MG PO TABS: 50.0000 mg | ORAL_TABLET | Freq: Every day | ORAL | 1 refills | Status: AC

## 2022-08-29 NOTE — Patient Instructions (Addendum)
It was great seeing you today!  Plan discussed at today's visit: -Restart Effexor 37.5 mg daily, Trazodone 50 mg and Seroquel 25 mg at night -Referral to Psychiatry placed -Plan for fasting labs next time  Follow up in: 6 weeks   Take care and let us know if you have any questions or concerns prior to your next visit.  Dr. Caralee Ates

## 2022-08-29 NOTE — Progress Notes (Signed)
New Patient Office Visit  Subjective    Patient ID: Brandon Cox, adult    DOB: 2000/06/27  Age: 22 y.o. MRN: [id number]  CC:  Chief Complaint  Patient presents with   Establish Care    Referral psych   Annual Exam    HPI Brandon Cox presents to establish care. Patient prefers the name Brandon Cox and prefers she/they pronouns.  Just moved back to [facility] from [place].  MDD/Bipolar disorder/BPD/NB: -2018 first hospitalized with SI -Had been on trazodone 100 mg, Effexor 75 mg and Seroquel 50 mg at bedtime -Is not currently on medication and has not been on for several months due to lack of insurance/funds -Does not currently have an outpatient psychiatrist -Had been on estrogen at one point but was unable to continue treatment due to lack of insurance, wants to hold off on hormonal therapy for now.     08/29/2022    2:02 PM  Depression screen PHQ 2/9  Decreased Interest 2  Down, Depressed, Hopeless 2  PHQ - 2 Score 4  Altered sleeping 3  Tired, decreased energy 3  Change in appetite 3  Feeling bad or failure about yourself  1  Trouble concentrating 3  Moving slowly or fidgety/restless 0  Suicidal thoughts 0  PHQ-9 Score 17  Difficult doing work/chores Very difficult   Health Maintenance: -Blood work due, will obtain at follow up -Tdap due   Outpatient Encounter Medications as of 08/29/2022  Medication Sig   QUEtiapine (SEROQUEL) 25 MG tablet Take 1 tablet (25 mg total) by mouth at bedtime.   traZODone (DESYREL) 50 MG tablet Take 1 tablet (50 mg total) by mouth at bedtime.   venlafaxine XR (EFFEXOR XR) 37.5 MG 24 hr capsule Take 1 capsule (37.5 mg total) by mouth daily with breakfast.   [DISCONTINUED] estradiol (ESTRACE) 2 MG tablet Take 1 tablet (2 mg total) by mouth daily.   [DISCONTINUED] QUEtiapine (SEROQUEL) 50 MG tablet Take 1 tablet (50 mg total) by mouth at bedtime.   [DISCONTINUED] spironolactone (ALDACTONE) 25 MG tablet Take 0.5 tablets (12.5 mg total)  by mouth daily at 6 PM.   [DISCONTINUED] traZODone (DESYREL) 100 MG tablet Take 1 tablet (100 mg total) by mouth at bedtime as needed for sleep.   [DISCONTINUED] venlafaxine XR (EFFEXOR-XR) 75 MG 24 hr capsule Take 1 capsule (75 mg total) by mouth daily with breakfast.   No facility-administered encounter medications on file as of 08/29/2022.    Past Medical History:  Diagnosis Date   Bipolar 1 disorder (HCC)    Borderline personality disorder (HCC)    Depression    Fracture, cervical vertebra (HCC) 10/22/2015   c6   History of ITP    Insomnia     History reviewed. No pertinent surgical history.  Family History  Problem Relation Age of Onset   Healthy Mother    Drug abuse Cousin     Social History   Socioeconomic History   Marital status: Single    Spouse name: Not on file   Number of children: 0   Years of education: Not on file   Highest education level: High school graduate  Occupational History   Occupation: [employer]    Comment: part time  Tobacco Use   Smoking status: Every Day    Packs/day: 1    Types: Cigarettes   Smokeless tobacco: Former    Types: Chew   Tobacco comments:    1 pack per week  Vaping Use  Vaping Use: Every day  Substance and Sexual Activity   Alcohol use: Yes    Comment: rare   Drug use: Yes    Types: Marijuana    Comment: used a couple of times in the last couple weeks   Sexual activity: Not Currently  Other Topics Concern   Not on file  Social History Narrative   Not on file   Social Determinants of Health   Financial Resource Strain: High Risk (03/15/2017)   Overall Financial Resource Strain (CARDIA)    Difficulty of Paying Living Expenses: Very hard  Food Insecurity: No Food Insecurity (03/15/2017)   Hunger Vital Sign    Worried About Running Out of Food in the Last Year: Never true    Ran Out of Food in the Last Year: Never true  Transportation Needs: No Transportation Needs (03/15/2017)   PRAPARE - Therapist, art (Medical): No    Lack of Transportation (Non-Medical): No  Physical Activity: Inactive (03/15/2017)   Exercise Vital Sign    Days of Exercise per Week: 0 days    Minutes of Exercise per Session: 0 min  Stress: Stress Concern Present (03/15/2017)   [employer] - Occupational Stress Questionnaire    Feeling of Stress : Very much  Social Connections: Moderately Isolated (03/15/2017)   Social Connection and Isolation Panel [NHANES]    Frequency of Communication with Friends and Family: Once a week    Frequency of Social Gatherings with Friends and Family: More than three times a week    Attends Religious Services: Never    Database administrator or Organizations: No    Attends Banker Meetings: Never    Marital Status: Never married  Intimate Partner Violence: Not At Risk (03/15/2017)   Humiliation, Afraid, Rape, and Kick questionnaire    Fear of Current or Ex-Partner: No    Emotionally Abused: No    Physically Abused: No    Sexually Abused: No    Review of Systems  All other systems reviewed and are negative.       Objective    BP 116/80   Pulse (!) 119   Resp 18   Ht 6\' 1"  (1.854 m)   Wt 223 lb 1.6 oz (101.2 kg)   SpO2 96%   BMI 29.43 kg/m   Physical Exam Constitutional:      Appearance: Normal appearance.  HENT:     Head: Normocephalic and atraumatic.     Mouth/Throat:     Mouth: Mucous membranes are moist.     Pharynx: Oropharynx is clear.  Eyes:     Extraocular Movements: Extraocular movements intact.     Conjunctiva/sclera: Conjunctivae normal.     Pupils: Pupils are equal, round, and reactive to light.  Cardiovascular:     Rate and Rhythm: Normal rate and regular rhythm.  Pulmonary:     Effort: Pulmonary effort is normal.     Breath sounds: Normal breath sounds.  Musculoskeletal:     Right lower leg: No edema.     Left lower leg: No edema.  Skin:    General: Skin is warm and dry.   Neurological:     General: No focal deficit present.     Mental Status: She is alert. Mental status is at baseline.  Psychiatric:        Mood and Affect: Mood normal.        Behavior: Behavior normal.  Assessment & Plan:   1. Severe recurrent major depression without psychotic features (HCC)/Insomnia, unspecified type: Restart back on previous medications, Effexor 37.5 mg, Trazodone 50 mg and Seroquel 25 mg at bedtime. Referral placed to psychiatry.  Follow-up here in 6 weeks to recheck medications.  - Ambulatory referral to Psychiatry - traZODone (DESYREL) 50 MG tablet; Take 1 tablet (50 mg total) by mouth at bedtime.  Dispense: 30 tablet; Refill: 1 - QUEtiapine (SEROQUEL) 25 MG tablet; Take 1 tablet (25 mg total) by mouth at bedtime.  Dispense: 30 tablet; Refill: 1 - venlafaxine XR (EFFEXOR XR) 37.5 MG 24 hr capsule; Take 1 capsule (37.5 mg total) by mouth daily with breakfast.  Dispense: 30 capsule; Refill: 1   Return in about 6 weeks (around 10/10/2022) for CPE.   Margarita Mail, DO

## 2022-09-01 ENCOUNTER — Ambulatory Visit: Payer: Medicaid Other

## 2022-09-17 DIAGNOSIS — Z419 Encounter for procedure for purposes other than remedying health state, unspecified: Secondary | ICD-10-CM | POA: Diagnosis not present

## 2022-10-17 DIAGNOSIS — Z419 Encounter for procedure for purposes other than remedying health state, unspecified: Secondary | ICD-10-CM | POA: Diagnosis not present

## 2022-10-18 ENCOUNTER — Encounter: Payer: Medicaid Other | Admitting: Internal Medicine

## 2022-10-31 ENCOUNTER — Ambulatory Visit: Payer: Self-pay | Admitting: Internal Medicine

## 2022-11-09 DIAGNOSIS — R112 Nausea with vomiting, unspecified: Secondary | ICD-10-CM | POA: Diagnosis not present

## 2022-11-09 DIAGNOSIS — K921 Melena: Secondary | ICD-10-CM | POA: Diagnosis not present

## 2022-11-09 DIAGNOSIS — R1010 Upper abdominal pain, unspecified: Secondary | ICD-10-CM | POA: Diagnosis not present

## 2022-11-12 ENCOUNTER — Other Ambulatory Visit: Payer: Self-pay

## 2022-11-12 ENCOUNTER — Emergency Department (HOSPITAL_COMMUNITY): Payer: Medicaid Other

## 2022-11-12 ENCOUNTER — Emergency Department (HOSPITAL_COMMUNITY)
Admission: EM | Admit: 2022-11-12 | Discharge: 2022-11-13 | Disposition: A | Discharge status: Home / Self Care | Payer: Medicaid Other | Attending: Emergency Medicine | Admitting: Emergency Medicine

## 2022-11-12 DIAGNOSIS — R079 Chest pain, unspecified: Secondary | ICD-10-CM | POA: Diagnosis not present

## 2022-11-12 DIAGNOSIS — Z20822 Contact with and (suspected) exposure to covid-19: Secondary | ICD-10-CM | POA: Insufficient documentation

## 2022-11-12 DIAGNOSIS — D72829 Elevated white blood cell count, unspecified: Secondary | ICD-10-CM | POA: Diagnosis not present

## 2022-11-12 DIAGNOSIS — R0789 Other chest pain: Secondary | ICD-10-CM | POA: Insufficient documentation

## 2022-11-12 DIAGNOSIS — R059 Cough, unspecified: Secondary | ICD-10-CM | POA: Insufficient documentation

## 2022-11-12 LAB — CBC
HCT: 47.2 % (ref 39.0–52.0)
Hemoglobin: 16.5 g/dL (ref 13.0–17.0)
MCH: 30.7 pg (ref 26.0–34.0)
MCHC: 35 g/dL (ref 30.0–36.0)
MCV: 87.9 fL (ref 80.0–100.0)
Platelets: 311 10*3/uL (ref 150–400)
RBC: 5.37 MIL/uL (ref 4.22–5.81)
RDW: 11.9 % (ref 11.5–15.5)
WBC: 12.9 10*3/uL — ABNORMAL HIGH (ref 4.0–10.5)
nRBC: 0 % (ref 0.0–0.2)

## 2022-11-12 LAB — BASIC METABOLIC PANEL
Anion gap: 12 (ref 5–15)
BUN: 7 mg/dL (ref 6–20)
CO2: 25 mmol/L (ref 22–32)
Calcium: 9.4 mg/dL (ref 8.9–10.3)
Chloride: 100 mmol/L (ref 98–111)
Creatinine, Ser: 0.94 mg/dL (ref 0.61–1.24)
GFR, Estimated: >60 mL/min (ref >60)
Glucose, Bld: 111 mg/dL — ABNORMAL HIGH (ref 70–99)
Potassium: 3.5 mmol/L (ref 3.5–5.1)
Sodium: 137 mmol/L (ref 135–145)

## 2022-11-12 LAB — TROPONIN I (HIGH SENSITIVITY): Troponin I (High Sensitivity): 6 ng/L (ref <18)

## 2022-11-12 NOTE — ED Triage Notes (Signed)
Pt presents with CP since yesterday, worsening today.  States feels like sharp twinges in his left breast area and a heaviness in his left arm.  No nausea.  Pt appears tearful and anxious but does state his eyes have been watering today with the chest pain.

## 2022-11-13 LAB — SARS CORONAVIRUS 2 BY RT PCR: SARS Coronavirus 2 by RT PCR: NEGATIVE

## 2022-11-13 LAB — TROPONIN I (HIGH SENSITIVITY): Troponin I (High Sensitivity): 5 ng/L (ref <18)

## 2022-11-13 MED ORDER — CYCLOBENZAPRINE HCL 10 MG PO TABS: 10.0000 mg | ORAL_TABLET | Freq: Three times a day (TID) | ORAL | 0 refills | Status: AC | PRN

## 2022-11-13 MED ORDER — IBUPROFEN 600 MG PO TABS: 600.0000 mg | ORAL_TABLET | Freq: Four times a day (QID) | ORAL | 0 refills | Status: AC | PRN

## 2022-11-13 MED ORDER — IBUPROFEN 800 MG PO TABS
800.0000 mg | ORAL_TABLET | Freq: Once | ORAL | Status: AC
  Administered 2022-11-13: 800 mg via ORAL
  Filled 2022-11-13: qty 1

## 2022-11-13 NOTE — ED Provider Notes (Signed)
[facility]ER[facility]ENC[facility] DEPT [facility] Emergency Department Provider Note MRN:  [id number]  Arrival date & time: 11/13/22     Chief Complaint   Chest Pain   History of Present Illness   Brandon Cox is a 22 y.o. year-old adult presents to the ED with chief complaint of chest pain.  He states that he first had some symptoms earlier this week, but then noticed more symptoms yesterday.  Reports that he has sharp twinges in his left breast that sometimes radiate around the side and into his arm.  His symptoms are worsened with movement.  He also reports that he has been coughing, but he denies fever or chills or SOB.  Denies hx of PE/DVT, recent surgery, immobilization, or calf/leg pain.  History provided by patient.   Review of Systems  Pertinent positive and negative review of systems noted in HPI.    Physical Exam   Vitals:   11/13/22 0000 11/13/22 0100  BP: 132/80 132/74  Pulse: 67 61  Resp: 18 19  Temp:  98.6 F (37 C)  SpO2: 97% 98%    CONSTITUTIONAL:  well-appearing, NAD NEURO:  Alert and oriented x 3, CN 3-12 grossly intact EYES:  eyes equal and reactive ENT/NECK:  Supple, no stridor  CARDIO:  normal rate, regular rhythm, appears well-perfused  PULM:  No respiratory distress, CTAB GI/GU:  non-distended, no focal tenderness MSK/SPINE:  No gross deformities, no edema, moves all extremities  SKIN:  no rash, atraumatic   *Additional and/or pertinent findings included in MDM below  Diagnostic and Interventional Summary    EKG Interpretation Date/Time:    Ventricular Rate:    PR Interval:    QRS Duration:    QT Interval:    QTC Calculation:   R Axis:      Text Interpretation:         Labs Reviewed  BASIC METABOLIC PANEL - Abnormal; Notable for the following components:      Result Value   Glucose, Bld 111 (*)    All other components within normal limits  CBC - Abnormal; Notable for the following components:   WBC 12.9 (*)    All other components  within normal limits  SARS CORONAVIRUS 2 BY RT PCR  TROPONIN I (HIGH SENSITIVITY)  TROPONIN I (HIGH SENSITIVITY)    DG Chest 2 View  Final Result      Medications  ibuprofen (ADVIL) tablet 800 mg (has no administration in time range)     Procedures  /  Critical Care Procedures  ED Course and Medical Decision Making  I have reviewed the triage vital signs, the nursing notes, and pertinent available records from the EMR.  Social Determinants Affecting Complexity of Care: Patient has no clinically significant social determinants affecting this chief complaint..   ED Course:    Medical Decision Making Patient here with chest pain that has worsened since yesterday.  Symptoms are worsened with movement.  Patient also has cough.    Doubt PE, PERC negative.  No hypoxic nor tachycardic.  No SOB.  Denies fever.  CXR negative for pneumonia.  COVID negative.  Normal trops, non-ischemic EKG, doubt ACS.  Mild leukocytosis.  Could be viral cough causing chest wall soreness.  Will treat with NSAIDs.  Will also trial a muscle relaxer.   Amount and/or Complexity of Data Reviewed Labs: ordered. Radiology: ordered and independent interpretation performed.    Details: No opacity or ptx ECG/medicine tests: independent interpretation performed.    Details: NSR  Risk  Prescription drug management.         Consultants: No consultations were needed in caring for this patient.   Treatment and Plan: I considered admission due to patient's initial presentation, but after considering the examination and diagnostic results, patient will not require admission and can be discharged with outpatient follow-up.    Final Clinical Impressions(s) / ED Diagnoses     ICD-10-CM   1. Chest wall pain  R07.89       ED Discharge Orders          Ordered    ibuprofen (ADVIL) 600 MG tablet  Every 6 hours PRN        11/13/22 0210    cyclobenzaprine (FLEXERIL) 10 MG tablet  3 times daily PRN         11/13/22 0210              Discharge Instructions Discussed with and Provided to Patient:   Discharge Instructions   None      Roxy Horseman, PA-C 11/13/22 0222    Gilda Crease, MD 11/13/22 0630

## 2022-11-17 DIAGNOSIS — Z419 Encounter for procedure for purposes other than remedying health state, unspecified: Secondary | ICD-10-CM | POA: Diagnosis not present

## 2022-11-20 NOTE — Progress Notes (Deleted)
   Acute Office Visit  Subjective:     Patient ID: Brandon Cox, adult    DOB: 2000/09/06, 22 y.o.   MRN: [id number]  No chief complaint on file.   HPI Patient is in today for chest pain?  CHEST PAIN Time since onset: Duration:{Blank single:19197::"days","weeks","months"} Onset: {Blank single:19197::"sudden","gradual"} Quality: {Blank multiple:19196::"sharp","dull","aching","burning","cramping","ill-defined","itchy","pressure-like","pulling","shooting","sore","stabbing","tender","tearing","throbbing"} Severity: {Blank single:19197::"mild","moderate","severe","1/10","2/10","3/10","4/10","5/10","6/10","7/10","8/10","9/10","10/10"} Location: {Blank single:19197::"substernal","left para substernal","right para substernal","subxiphoid","upper right","upper left","upper sternum"} Radiation: {Blank single:19197::"none","neck","jaw","left arm","right arm","back"} Episode duration:  Frequency: {Blank single:19197::"constant","intermittent","occasional","rare","every few minutes","a few times a hour","a few times a day","a few times a week","a few times a month","a few times a year"} Related to exertion: {Blank single:19197::"yes","no"} Activity when pain started:  Trauma: {Blank single:19197::"yes","no"} Anxiety/recent stressors: {Blank single:19197::"yes","no"} Aggravating factors:  Alleviating factors:  Status: {Blank multiple:19196::"better","worse","stable","fluctuating"} Treatments attempted: {Blank multiple:19196::"nothing","APAP","ibuprofen","antacids"}  Current pain status: {Blank single:19197::"pain free","in pain","chest wall tender"} Shortness of breath: {Blank single:19197::"yes","no"} Cough: {Blank single:19197::"yes","no","yes, productive","yes, non-productive"} Nausea: {Blank single:19197::"yes","no"} Diaphoresis: {Blank single:19197::"yes","no"} Heartburn: {Blank single:19197::"yes","no"} Palpitations: {Blank single:19197::"yes","no"}   ROS      Objective:     There were no vitals taken for this visit. {Vitals History (Optional):23777}  Physical Exam  No results found for any visits on 11/21/22.      Assessment & Plan:   Problem List Items Addressed This Visit   None   No orders of the defined types were placed in this encounter.   No follow-ups on file.  Margarita Mail, DO

## 2022-11-21 ENCOUNTER — Ambulatory Visit: Payer: Medicaid Other | Admitting: Internal Medicine

## 2022-12-18 DIAGNOSIS — Z419 Encounter for procedure for purposes other than remedying health state, unspecified: Secondary | ICD-10-CM | POA: Diagnosis not present

## 2023-01-17 DIAGNOSIS — Z419 Encounter for procedure for purposes other than remedying health state, unspecified: Secondary | ICD-10-CM | POA: Diagnosis not present

## 2023-02-17 DIAGNOSIS — R0981 Nasal congestion: Secondary | ICD-10-CM | POA: Diagnosis not present

## 2023-02-17 DIAGNOSIS — R6883 Chills (without fever): Secondary | ICD-10-CM | POA: Diagnosis not present

## 2023-02-17 DIAGNOSIS — R051 Acute cough: Secondary | ICD-10-CM | POA: Diagnosis not present

## 2023-02-17 DIAGNOSIS — J029 Acute pharyngitis, unspecified: Secondary | ICD-10-CM | POA: Diagnosis not present

## 2023-02-17 DIAGNOSIS — Z419 Encounter for procedure for purposes other than remedying health state, unspecified: Secondary | ICD-10-CM | POA: Diagnosis not present

## 2023-03-19 DIAGNOSIS — Z419 Encounter for procedure for purposes other than remedying health state, unspecified: Secondary | ICD-10-CM | POA: Diagnosis not present

## 2023-04-19 DIAGNOSIS — Z419 Encounter for procedure for purposes other than remedying health state, unspecified: Secondary | ICD-10-CM | POA: Diagnosis not present

## 2023-05-20 DIAGNOSIS — Z419 Encounter for procedure for purposes other than remedying health state, unspecified: Secondary | ICD-10-CM | POA: Diagnosis not present

## 2023-06-17 DIAGNOSIS — Z419 Encounter for procedure for purposes other than remedying health state, unspecified: Secondary | ICD-10-CM | POA: Diagnosis not present

## 2023-07-29 DIAGNOSIS — Z419 Encounter for procedure for purposes other than remedying health state, unspecified: Secondary | ICD-10-CM | POA: Diagnosis not present

## 2023-08-28 DIAGNOSIS — Z419 Encounter for procedure for purposes other than remedying health state, unspecified: Secondary | ICD-10-CM | POA: Diagnosis not present

## 2023-09-28 DIAGNOSIS — Z419 Encounter for procedure for purposes other than remedying health state, unspecified: Secondary | ICD-10-CM | POA: Diagnosis not present

## 2023-10-28 DIAGNOSIS — Z419 Encounter for procedure for purposes other than remedying health state, unspecified: Secondary | ICD-10-CM | POA: Diagnosis not present

## 2023-11-28 DIAGNOSIS — Z419 Encounter for procedure for purposes other than remedying health state, unspecified: Secondary | ICD-10-CM | POA: Diagnosis not present
